# Patient Record
Sex: Female | Born: 1980 | Race: Black or African American | Hispanic: No | Smoking: Former smoker
Health system: Southern US, Community
[De-identification: ages and names within clinical notes are randomized; demographics above are authoritative.]

## PROBLEM LIST (undated history)

## (undated) DIAGNOSIS — O24419 Gestational diabetes mellitus in pregnancy, unspecified control: Secondary | ICD-10-CM

## (undated) DIAGNOSIS — I10 Essential (primary) hypertension: Secondary | ICD-10-CM

## (undated) DIAGNOSIS — Z789 Other specified health status: Secondary | ICD-10-CM

## (undated) HISTORY — DX: Other specified health status: Z78.9

---

## 2018-04-24 DIAGNOSIS — R0789 Other chest pain: Secondary | ICD-10-CM | POA: Diagnosis not present

## 2018-04-24 DIAGNOSIS — R079 Chest pain, unspecified: Secondary | ICD-10-CM | POA: Diagnosis not present

## 2018-05-05 DIAGNOSIS — Z0389 Encounter for observation for other suspected diseases and conditions ruled out: Secondary | ICD-10-CM | POA: Diagnosis not present

## 2018-05-05 DIAGNOSIS — Z3009 Encounter for other general counseling and advice on contraception: Secondary | ICD-10-CM | POA: Diagnosis not present

## 2018-05-05 DIAGNOSIS — Z1388 Encounter for screening for disorder due to exposure to contaminants: Secondary | ICD-10-CM | POA: Diagnosis not present

## 2018-12-28 DIAGNOSIS — Z3201 Encounter for pregnancy test, result positive: Secondary | ICD-10-CM | POA: Diagnosis not present

## 2018-12-28 DIAGNOSIS — O26891 Other specified pregnancy related conditions, first trimester: Secondary | ICD-10-CM | POA: Diagnosis not present

## 2018-12-28 DIAGNOSIS — R102 Pelvic and perineal pain: Secondary | ICD-10-CM | POA: Diagnosis not present

## 2018-12-28 DIAGNOSIS — O219 Vomiting of pregnancy, unspecified: Secondary | ICD-10-CM | POA: Diagnosis not present

## 2018-12-28 DIAGNOSIS — Z3A01 Less than 8 weeks gestation of pregnancy: Secondary | ICD-10-CM | POA: Diagnosis not present

## 2019-01-25 DIAGNOSIS — Z0389 Encounter for observation for other suspected diseases and conditions ruled out: Secondary | ICD-10-CM | POA: Diagnosis not present

## 2019-01-25 DIAGNOSIS — Z3481 Encounter for supervision of other normal pregnancy, first trimester: Secondary | ICD-10-CM | POA: Diagnosis not present

## 2019-01-25 DIAGNOSIS — Z1388 Encounter for screening for disorder due to exposure to contaminants: Secondary | ICD-10-CM | POA: Diagnosis not present

## 2019-01-25 DIAGNOSIS — N760 Acute vaginitis: Secondary | ICD-10-CM | POA: Diagnosis not present

## 2019-01-25 DIAGNOSIS — O99210 Obesity complicating pregnancy, unspecified trimester: Secondary | ICD-10-CM | POA: Diagnosis not present

## 2019-01-25 DIAGNOSIS — O210 Mild hyperemesis gravidarum: Secondary | ICD-10-CM | POA: Diagnosis not present

## 2019-01-25 DIAGNOSIS — R87610 Atypical squamous cells of undetermined significance on cytologic smear of cervix (ASC-US): Secondary | ICD-10-CM | POA: Diagnosis not present

## 2019-01-25 DIAGNOSIS — Z3009 Encounter for other general counseling and advice on contraception: Secondary | ICD-10-CM | POA: Diagnosis not present

## 2019-01-25 DIAGNOSIS — A5901 Trichomonal vulvovaginitis: Secondary | ICD-10-CM | POA: Diagnosis not present

## 2019-01-25 DIAGNOSIS — Z550 Illiteracy and low-level literacy: Secondary | ICD-10-CM | POA: Diagnosis not present

## 2019-01-25 DIAGNOSIS — O09519 Supervision of elderly primigravida, unspecified trimester: Secondary | ICD-10-CM | POA: Diagnosis not present

## 2019-01-25 DIAGNOSIS — Z113 Encounter for screening for infections with a predominantly sexual mode of transmission: Secondary | ICD-10-CM | POA: Diagnosis not present

## 2019-01-26 DIAGNOSIS — Z3481 Encounter for supervision of other normal pregnancy, first trimester: Secondary | ICD-10-CM | POA: Diagnosis not present

## 2019-02-18 ENCOUNTER — Other Ambulatory Visit: Payer: Self-pay

## 2019-02-18 ENCOUNTER — Ambulatory Visit (INDEPENDENT_AMBULATORY_CARE_PROVIDER_SITE_OTHER): Payer: Medicaid Other | Admitting: Family Medicine

## 2019-02-18 ENCOUNTER — Encounter: Payer: Self-pay | Admitting: Family Medicine

## 2019-02-18 VITALS — BP 112/61 | HR 72 | Ht 65.0 in | Wt 231.0 lb

## 2019-02-18 DIAGNOSIS — O09899 Supervision of other high risk pregnancies, unspecified trimester: Secondary | ICD-10-CM | POA: Insufficient documentation

## 2019-02-18 DIAGNOSIS — O09892 Supervision of other high risk pregnancies, second trimester: Secondary | ICD-10-CM | POA: Diagnosis not present

## 2019-02-18 DIAGNOSIS — Z23 Encounter for immunization: Secondary | ICD-10-CM | POA: Diagnosis not present

## 2019-02-18 DIAGNOSIS — Z8759 Personal history of other complications of pregnancy, childbirth and the puerperium: Secondary | ICD-10-CM | POA: Diagnosis not present

## 2019-02-18 DIAGNOSIS — O34219 Maternal care for unspecified type scar from previous cesarean delivery: Secondary | ICD-10-CM

## 2019-02-18 DIAGNOSIS — Z98891 History of uterine scar from previous surgery: Secondary | ICD-10-CM

## 2019-02-18 DIAGNOSIS — O09292 Supervision of pregnancy with other poor reproductive or obstetric history, second trimester: Secondary | ICD-10-CM

## 2019-02-18 NOTE — Progress Notes (Signed)
Subjective:  Kayla Krueger is a [id number] 34w2dbeing seen today for her first obstetrical visit in our office. She was previously seen at [facility] She was referred here due to history of GHTN and miscarriage at 17 weeks. Her obstetrical history is significant for 2 prior cesarean sections. The first one was born via emergency c/s at 25 weeks due to fetal heart rate. Second was repeat cesarean delivery. Patient does intend to breast feed. Pregnancy history fully reviewed.  Patient reports no complaints.  BP 112/61   Pulse 72   Ht '5\' 5"'  (1.651 m)   Wt 231 lb 0.6 oz (104.8 kg)   LMP 11/03/2018 (Exact Date)   BMI 38.45 kg/m   HISTORY: OB History  Gravida Para Term Preterm AB Living  '5 3   3 1 2  ' SAB TAB Ectopic Multiple Live Births  1       2    # Outcome Date GA Lbr Len/2nd Weight Sex Delivery Anes PTL Lv  5 Current           4 Preterm 2015 345w0d M CS-LTranv EPI N LIV  3 Preterm 2014        FD  2 SAB 2014          1 Preterm 2007 [id number] CS-LTranv EPI N LIV    No past medical history on file.  Past Surgical History:  Procedure Laterality Date  . CESAREAN SECTION      Family History  Problem Relation Age of Onset  . Breast cancer Mother      Exam  BP 112/61   Pulse 72   Ht '5\' 5"'  (1.651 m)   Wt 231 lb 0.6 oz (104.8 kg)   LMP 11/03/2018 (Exact Date)   BMI 38.45 kg/m   CONSTITUTIONAL: Well-developed, well-nourished female in no acute distress.  HENT:  Normocephalic, atraumatic, External right and left ear normal. Oropharynx is clear and moist EYES: Conjunctivae and EOM are normal. Pupils are equal, round, and reactive to light. No scleral icterus.  CARDIOVASCULAR: Normal heart rate noted, regular rhythm RESPIRATORY: Clear to auscultation bilaterally. Effort and breath sounds normal, no problems with respiration noted. ABDOMEN: Soft, normal bowel sounds, no distention noted.  No tenderness, rebound or guarding.  MUSCULOSKELETAL: Normal range of motion. No  tenderness.  No cyanosis, clubbing, or edema.  2+ distal pulses. SKIN: Skin is warm and dry. No rash noted. Not diaphoretic. No erythema. No pallor. NEUROLOGIC: Alert and oriented to person, place, and time. Normal reflexes, muscle tone coordination. No cranial nerve deficit noted. PSYCHIATRIC: Normal mood and affect. Normal behavior. Normal judgment and thought content.    Assessment:    Pregnancy: [id number] Active Problem List   Diagnosis Date Noted  . Supervision of other high risk pregnancy, antepartum 02/18/2019  . History of stillbirth in currently pregnant patient, second trimester 02/18/2019  . History of cesarean section 02/18/2019      Plan:   1. Supervision of other high risk pregnancy, antepartum FHT and FH normal - Flu Vaccine QUAD 36+ mos IM - Culture, OB Urine - US [place]M OB DETAIL +14 WK; Future - AFP, Serum, Open Spina Bifida - Genetic Screening  2. History of stillbirth in currently pregnant patient, second trimester Miscarriage at 17 82 weeks3. History of cesarean section First at 25 weeks, next one at 39 weeks. Will need to get records for first c/s to see if classical. - US [place] OB DETAIL +14 WK; Future  4. History of gestational hypertension CMP, UP:C Start ASA 32m. - Comp Met (CMET) - Protein / creatinine ratio, urine     Problem list reviewed and updated. 75% of 30 min visit spent on counseling and coordination of care.     JTruett Mainland10/29/2020

## 2019-02-19 LAB — COMPREHENSIVE METABOLIC PANEL
ALT: 7 IU/L (ref 0–32)
AST: 10 IU/L (ref 0–40)
Albumin/Globulin Ratio: 1.4 (ref 1.2–2.2)
Albumin: 3.7 g/dL — ABNORMAL LOW (ref 3.8–4.8)
Alkaline Phosphatase: 61 IU/L (ref 39–117)
BUN/Creatinine Ratio: 10 (ref 9–23)
BUN: 7 mg/dL (ref 6–20)
Bilirubin Total: <0.2 mg/dL (ref 0.0–1.2)
CO2: 22 mmol/L (ref 20–29)
Calcium: 9.4 mg/dL (ref 8.7–10.2)
Chloride: 101 mmol/L (ref 96–106)
Creatinine, Ser: 0.72 mg/dL (ref 0.57–1.00)
GFR calc Af Amer: 125 mL/min/{1.73_m2} (ref >59)
GFR calc non Af Amer: 108 mL/min/{1.73_m2} (ref >59)
Globulin, Total: 2.7 g/dL (ref 1.5–4.5)
Glucose: 75 mg/dL (ref 65–99)
Potassium: 3.9 mmol/L (ref 3.5–5.2)
Sodium: 136 mmol/L (ref 134–144)
Total Protein: 6.4 g/dL (ref 6.0–8.5)

## 2019-02-20 LAB — URINE CULTURE, OB REFLEX

## 2019-02-20 LAB — CULTURE, OB URINE

## 2019-02-23 DIAGNOSIS — H5213 Myopia, bilateral: Secondary | ICD-10-CM | POA: Diagnosis not present

## 2019-02-23 DIAGNOSIS — H52223 Regular astigmatism, bilateral: Secondary | ICD-10-CM | POA: Diagnosis not present

## 2019-02-23 LAB — AFP, SERUM, OPEN SPINA BIFIDA
AFP MoM: 0.72
AFP Value: 18.1 ng/mL
Gest. Age on Collection Date: 15.2 weeks
Maternal Age At EDD: 37.2 yr
OSBR Risk 1 IN: 10000
Test Results:: NEGATIVE
Weight: 231 [lb_av]

## 2019-03-16 ENCOUNTER — Other Ambulatory Visit: Payer: Self-pay

## 2019-03-16 ENCOUNTER — Ambulatory Visit (HOSPITAL_COMMUNITY): Payer: Medicaid Other | Admitting: *Deleted

## 2019-03-16 ENCOUNTER — Encounter (HOSPITAL_COMMUNITY): Payer: Self-pay

## 2019-03-16 ENCOUNTER — Ambulatory Visit (HOSPITAL_COMMUNITY)
Admission: RE | Admit: 2019-03-16 | Discharge: 2019-03-16 | Disposition: A | Discharge status: Home / Self Care | Payer: Medicaid Other | Source: Ambulatory Visit | Attending: Family Medicine | Admitting: Family Medicine

## 2019-03-16 DIAGNOSIS — O09899 Supervision of other high risk pregnancies, unspecified trimester: Secondary | ICD-10-CM

## 2019-03-16 DIAGNOSIS — Z3A19 19 weeks gestation of pregnancy: Secondary | ICD-10-CM | POA: Diagnosis not present

## 2019-03-16 DIAGNOSIS — Z98891 History of uterine scar from previous surgery: Secondary | ICD-10-CM | POA: Diagnosis not present

## 2019-03-16 DIAGNOSIS — O09292 Supervision of pregnancy with other poor reproductive or obstetric history, second trimester: Secondary | ICD-10-CM

## 2019-03-16 DIAGNOSIS — O09212 Supervision of pregnancy with history of pre-term labor, second trimester: Secondary | ICD-10-CM | POA: Diagnosis not present

## 2019-03-16 DIAGNOSIS — O09522 Supervision of elderly multigravida, second trimester: Secondary | ICD-10-CM | POA: Diagnosis not present

## 2019-03-16 DIAGNOSIS — O34219 Maternal care for unspecified type scar from previous cesarean delivery: Secondary | ICD-10-CM

## 2019-03-22 DIAGNOSIS — O09529 Supervision of elderly multigravida, unspecified trimester: Secondary | ICD-10-CM | POA: Insufficient documentation

## 2019-03-22 NOTE — Progress Notes (Deleted)
Subjective:  Kayla Krueger is a 38 y.o. [id number] at [redacted]w[redacted]d being seen today for ongoing prenatal care.  She is currently monitored for the following issues for this high-risk pregnancy and has Supervision of other high risk pregnancy, antepartum; History of stillbirth in currently pregnant patient, second trimester; History of cesarean section; History of gestational hypertension; and Advanced maternal age in multigravida on their problem list.  Patient reports {sx:14538}.   .  .   . Denies leaking of fluid.   The following portions of the patient's history were reviewed and updated as appropriate: allergies, current medications, past family history, past medical history, past social history, past surgical history and problem list. Problem list updated.  Objective:  There were no vitals filed for this visit.  Fetal Status:           General:  Alert, oriented and cooperative. Patient is in no acute distress.  Skin: Skin is warm and dry. No rash noted.   Cardiovascular: Normal heart rate noted  Respiratory: Normal respiratory effort, no problems with respiration noted  Abdomen: Soft, gravid, appropriate for gestational age.       Pelvic:       {Blank single:19197::"Cervical exam performed","Cervical exam deferred"}        Extremities: Normal range of motion.     Mental Status: Normal mood and affect. Normal behavior. Normal judgment and thought content.   Urinalysis:      Assessment and Plan:  Pregnancy: [id number] at [redacted]w[redacted]d  1. Supervision of other high risk pregnancy, antepartum *** - pt needs f/u anatomy scan scheduled around 04/15/2019 - peds list given - early A1C today - no HIV done with NOB labs, will do today - pregnancy history clarification?  2. History of stillbirth in currently pregnant patient, second trimester *** - miscarriage @17wks  - pt declined 17P @MFM   3. History of cesarean section *** - needs ROI for first C/S, RN aware  4. History of gestational  hypertension *** - BP today *** - pt on ASA - PCr baseline not done with NOB labs, will perform today  Preterm labor symptoms and general obstetric precautions including but not limited to vaginal bleeding, contractions, leaking of fluid and fetal movement were reviewed in detail with the patient. Please refer to After Visit Summary for other counseling recommendations.  No follow-ups on file.   Jameek Bruntz, Gerrie Nordmann, NP

## 2019-03-23 ENCOUNTER — Encounter: Payer: Medicaid Other | Admitting: Women's Health

## 2019-03-27 DIAGNOSIS — H5212 Myopia, left eye: Secondary | ICD-10-CM | POA: Diagnosis not present

## 2019-05-06 ENCOUNTER — Encounter: Payer: Medicaid Other | Admitting: Obstetrics & Gynecology

## 2019-05-07 ENCOUNTER — Inpatient Hospital Stay (HOSPITAL_COMMUNITY)
Admission: AD | Admit: 2019-05-07 | Discharge: 2019-05-07 | Disposition: A | Discharge status: Home / Self Care | Payer: Medicaid Other | Attending: Obstetrics and Gynecology | Admitting: Obstetrics and Gynecology

## 2019-05-07 ENCOUNTER — Encounter (HOSPITAL_COMMUNITY): Payer: Self-pay | Admitting: Obstetrics and Gynecology

## 2019-05-07 ENCOUNTER — Encounter: Payer: Medicaid Other | Admitting: Obstetrics & Gynecology

## 2019-05-07 ENCOUNTER — Other Ambulatory Visit: Payer: Self-pay

## 2019-05-07 DIAGNOSIS — O34219 Maternal care for unspecified type scar from previous cesarean delivery: Secondary | ICD-10-CM | POA: Diagnosis not present

## 2019-05-07 DIAGNOSIS — Z8632 Personal history of gestational diabetes: Secondary | ICD-10-CM | POA: Insufficient documentation

## 2019-05-07 DIAGNOSIS — Z87891 Personal history of nicotine dependence: Secondary | ICD-10-CM | POA: Diagnosis not present

## 2019-05-07 DIAGNOSIS — Z3A26 26 weeks gestation of pregnancy: Secondary | ICD-10-CM | POA: Diagnosis not present

## 2019-05-07 DIAGNOSIS — Z0371 Encounter for suspected problem with amniotic cavity and membrane ruled out: Secondary | ICD-10-CM | POA: Insufficient documentation

## 2019-05-07 DIAGNOSIS — O162 Unspecified maternal hypertension, second trimester: Secondary | ICD-10-CM | POA: Insufficient documentation

## 2019-05-07 DIAGNOSIS — Z8759 Personal history of other complications of pregnancy, childbirth and the puerperium: Secondary | ICD-10-CM | POA: Insufficient documentation

## 2019-05-07 DIAGNOSIS — O09522 Supervision of elderly multigravida, second trimester: Secondary | ICD-10-CM | POA: Diagnosis not present

## 2019-05-07 DIAGNOSIS — Z3689 Encounter for other specified antenatal screening: Secondary | ICD-10-CM

## 2019-05-07 DIAGNOSIS — Z803 Family history of malignant neoplasm of breast: Secondary | ICD-10-CM | POA: Insufficient documentation

## 2019-05-07 DIAGNOSIS — O09292 Supervision of pregnancy with other poor reproductive or obstetric history, second trimester: Secondary | ICD-10-CM

## 2019-05-07 DIAGNOSIS — O0992 Supervision of high risk pregnancy, unspecified, second trimester: Secondary | ICD-10-CM | POA: Insufficient documentation

## 2019-05-07 DIAGNOSIS — Z98891 History of uterine scar from previous surgery: Secondary | ICD-10-CM

## 2019-05-07 DIAGNOSIS — O09899 Supervision of other high risk pregnancies, unspecified trimester: Secondary | ICD-10-CM

## 2019-05-07 HISTORY — DX: Essential (primary) hypertension: I10

## 2019-05-07 HISTORY — DX: Gestational diabetes mellitus in pregnancy, unspecified control: O24.419

## 2019-05-07 LAB — CBC
HCT: 35.1 % — ABNORMAL LOW (ref 36.0–46.0)
Hemoglobin: 11.4 g/dL — ABNORMAL LOW (ref 12.0–15.0)
MCH: 27.8 pg (ref 26.0–34.0)
MCHC: 32.5 g/dL (ref 30.0–36.0)
MCV: 85.6 fL (ref 80.0–100.0)
Platelets: 334 10*3/uL (ref 150–400)
RBC: 4.1 MIL/uL (ref 3.87–5.11)
RDW: 14.9 % (ref 11.5–15.5)
WBC: 11.3 10*3/uL — ABNORMAL HIGH (ref 4.0–10.5)
nRBC: 0 % (ref 0.0–0.2)

## 2019-05-07 LAB — COMPREHENSIVE METABOLIC PANEL
ALT: 16 U/L (ref 0–44)
AST: 17 U/L (ref 15–41)
Albumin: 2.6 g/dL — ABNORMAL LOW (ref 3.5–5.0)
Alkaline Phosphatase: 79 U/L (ref 38–126)
Anion gap: 10 (ref 5–15)
BUN: 6 mg/dL (ref 6–20)
CO2: 23 mmol/L (ref 22–32)
Calcium: 9 mg/dL (ref 8.9–10.3)
Chloride: 104 mmol/L (ref 98–111)
Creatinine, Ser: 0.75 mg/dL (ref 0.44–1.00)
GFR calc Af Amer: >60 mL/min (ref >60)
GFR calc non Af Amer: >60 mL/min (ref >60)
Glucose, Bld: 102 mg/dL — ABNORMAL HIGH (ref 70–99)
Potassium: 3.5 mmol/L (ref 3.5–5.1)
Sodium: 137 mmol/L (ref 135–145)
Total Bilirubin: 0.4 mg/dL (ref 0.3–1.2)
Total Protein: 6.5 g/dL (ref 6.5–8.1)

## 2019-05-07 LAB — PROTEIN / CREATININE RATIO, URINE
Creatinine, Urine: 337.32 mg/dL
Protein Creatinine Ratio: 0.13 mg/mg{Cre} (ref 0.00–0.15)
Total Protein, Urine: 44 mg/dL

## 2019-05-07 LAB — FETAL FIBRONECTIN: Fetal Fibronectin: NEGATIVE

## 2019-05-07 LAB — AMNISURE RUPTURE OF MEMBRANE (ROM) NOT AT ARMC: Amnisure ROM: NEGATIVE

## 2019-05-07 NOTE — MAU Provider Note (Signed)
History     CSN: [id number]  Arrival date and time: 05/07/19 1415   First Provider Initiated Contact with Patient 05/07/19 1551      Chief Complaint  Patient presents with  . Rupture of Membranes   [name] is a 39 y.o. [id number] at [redacted]w[redacted]d who presents to MAU for possible ROM. Pt denies anything in the vagina in the past 24hrs.  Onset: 1hr ago Location: vagina Duration: <24hrs Character: pt reports about an hour ago she started walking to leave the house and reports she felt some fluid soak through to her underwear, pt describes fluid as clear with white/milky substance at end, pt reports this was a one time instance and has not happened again since Aggravating/Associated: none/cramps bilaterally x2.5wks without worsening Relieving: none Treatment: none Severity: 0/10  Pt denies VB, ctx, decreased FM, vaginal discharge/odor/itching. Pt denies N/V, abdominal pain, constipation, diarrhea, or urinary problems. Pt denies fever, chills, fatigue, sweating or changes in appetite. Pt denies SOB or chest pain. Pt denies dizziness, HA, light-headedness, weakness.  Problems this pregnancy include: hx PTD, hx C/S x2. Allergies? NKDA Current medications/supplements? PNVs Prenatal care provider? [facility], next appt 05/11/2019   OB History    Gravida  5   Para  3   Term      Preterm  3   AB  1   Living  2     SAB  1   TAB      Ectopic      Multiple      Live Births  2           Past Medical History:  Diagnosis Date  . Gestational diabetes   . Hypertension   . Medical history non-contributory     Past Surgical History:  Procedure Laterality Date  . CESAREAN SECTION      Family History  Problem Relation Age of Onset  . Breast cancer Mother     Social History   Tobacco Use  . Smoking status: Former Smoker    Types: Cigarettes    Quit date: 05/06/2018    Years since quitting: 1.0  . Smokeless tobacco: Never Used  Substance Use Topics  .  Alcohol use: Never  . Drug use: Never    Allergies: No Known Allergies  Medications Prior to Admission  Medication Sig Dispense Refill Last Dose  . Prenatal Vit-Fe Fumarate-FA (PRENATAL VITAMINS PO) Take by mouth.   05/06/2019 at Unknown time    Review of Systems  Constitutional: Negative for chills, diaphoresis, fatigue and fever.  Eyes: Negative for visual disturbance.  Respiratory: Negative for shortness of breath.   Cardiovascular: Negative for chest pain.  Gastrointestinal: Negative for abdominal pain, constipation, diarrhea, nausea and vomiting.  Genitourinary: Positive for pelvic pain (intermittent cramping) and vaginal discharge. Negative for dysuria, flank pain, frequency, urgency and vaginal bleeding.  Neurological: Negative for dizziness, weakness, light-headedness and headaches.   Physical Exam   Blood pressure (!) 147/76, pulse (!) 112, temperature 98.4 F (36.9 C), temperature source Oral, resp. rate 17, height 5\' 5"  (1.651 m), weight 111 kg, last menstrual period 11/03/2018, SpO2 98 %.  Patient Vitals for the past 24 hrs:  BP Temp Temp src Pulse Resp SpO2 Height Weight  05/07/19 1853 (!) 147/76 -- -- -- -- -- -- --  05/07/19 1705 -- -- -- -- -- 98 % -- --  05/07/19 1700 123/70 -- -- (!) 112 -- 98 % -- --  05/07/19 1650 -- -- -- -- --  99 % -- --  05/07/19 1645 120/72 -- -- (!) 110 -- 99 % -- --  05/07/19 1642 124/71 -- -- (!) 119 -- -- -- --  05/07/19 1640 -- -- -- -- -- 99 % -- --  05/07/19 1535 -- -- -- -- -- 98 % -- --  05/07/19 1534 140/70 -- -- (!) 108 -- -- -- --  05/07/19 1502 126/71 98.4 F (36.9 C) Oral (!) 101 17 98 % 5\' 5"  (1.651 m) 111 kg   Physical Exam  Constitutional: She is oriented to person, place, and time. She appears well-developed and well-nourished. No distress.  HENT:  Head: Normocephalic and atraumatic.  Respiratory: Effort normal.  GI: Soft. She exhibits no distension and no mass. There is no abdominal tenderness. There is no rebound  and no guarding.  Genitourinary:    No vaginal discharge.   Neurological: She is alert and oriented to person, place, and time.  Skin: Skin is warm and dry. She is not diaphoretic.  Psychiatric: She has a normal mood and affect. Her behavior is normal. Judgment and thought content normal.   Results for orders placed or performed during the hospital encounter of 05/07/19 (from the past 24 hour(s))  CBC     Status: Abnormal   Collection Time: 05/07/19  4:00 PM  Result Value Ref Range   WBC 11.3 (H) 4.0 - 10.5 K/uL   RBC 4.10 3.87 - 5.11 MIL/uL   Hemoglobin 11.4 (L) 12.0 - 15.0 g/dL   HCT 05/09/19 (L) 40.8 - 14.4 %   MCV 85.6 80.0 - 100.0 fL   MCH 27.8 26.0 - 34.0 pg   MCHC 32.5 30.0 - 36.0 g/dL   RDW 81.8 56.3 - 14.9 %   Platelets 334 150 - 400 K/uL   nRBC 0.0 0.0 - 0.2 %  Comprehensive metabolic panel     Status: Abnormal   Collection Time: 05/07/19  4:00 PM  Result Value Ref Range   Sodium 137 135 - 145 mmol/L   Potassium 3.5 3.5 - 5.1 mmol/L   Chloride 104 98 - 111 mmol/L   CO2 23 22 - 32 mmol/L   Glucose, Bld 102 (H) 70 - 99 mg/dL   BUN 6 6 - 20 mg/dL   Creatinine, Ser 05/09/19 0.44 - 1.00 mg/dL   Calcium 9.0 8.9 - 6.37 mg/dL   Total Protein 6.5 6.5 - 8.1 g/dL   Albumin 2.6 (L) 3.5 - 5.0 g/dL   AST 17 15 - 41 U/L   ALT 16 0 - 44 U/L   Alkaline Phosphatase 79 38 - 126 U/L   Total Bilirubin 0.4 0.3 - 1.2 mg/dL   GFR calc non Af Amer >60 >60 mL/min   GFR calc Af Amer >60 >60 mL/min   Anion gap 10 5 - 15  Amnisure rupture of membrane (rom)not at [facility]     Status: None   Collection Time: 05/07/19  5:14 PM  Result Value Ref Range   Amnisure ROM NEGATIVE   Fetal fibronectin     Status: None   Collection Time: 05/07/19  5:14 PM  Result Value Ref Range   Fetal Fibronectin NEGATIVE NEGATIVE  Protein / creatinine ratio, urine     Status: None   Collection Time: 05/07/19  5:30 PM  Result Value Ref Range   Creatinine, Urine 337.32 mg/dL   Total Protein, Urine 44 mg/dL   Protein  Creatinine Ratio 0.13 0.00 - 0.15 mg/mg[Cre]   MAU Course  Procedures  MDM -  r/o ROM -hx of PTD @26wks  -pressure 140/70, preeclampsia labs drawn, pt has cHTN -CE: long/closed (external os 0.5, internal closed)/posterior -UA: pending at time of discharge (pt not able to urinate until late in MAU course) -CBC: WNL, platelets 334 -CMP: WNL, AST/ALT 17/16, serum creatinine 0.75 -PCr: 0.13 -fFN: negative -AmniSure: negative -ZGY:[id number]       -baseline: 150       -variability: moderate       -accels: present, 10x10 and 15x15       -decels: few variables       -TOCO: no ctx -pt discharged to home in stable condition  Orders Placed This Encounter  Procedures  . Culture, OB Urine    Standing Status:   Standing    Number of Occurrences:   1  . CBC    Standing Status:   Standing    Number of Occurrences:   1  . Comprehensive metabolic panel    Standing Status:   Standing    Number of Occurrences:   1  . Protein / creatinine ratio, urine    Standing Status:   Standing    Number of Occurrences:   1  . Amnisure rupture of membrane (rom)not at [facility]    Standing Status:   Standing    Number of Occurrences:   1  . Fetal fibronectin    Standing Status:   Standing    Number of Occurrences:   1  . Urinalysis, Routine w reflex microscopic    Standing Status:   Standing    Number of Occurrences:   1  . Discharge patient    Order Specific Question:   Discharge disposition    Answer:   01-Home or Self Care [1]    Order Specific Question:   Discharge patient date    Answer:   05/07/2019   Assessment and Plan   1. Encounter for suspected premature rupture of amniotic membranes, with rupture of membranes not found   2. Multigravida of advanced maternal age in second trimester   3. Supervision of other high risk pregnancy, antepartum   4. History of stillbirth in currently pregnant patient, second trimester   5. History of cesarean section   6. [redacted] weeks gestation of pregnancy   7. NST  (non-stress test) reactive    Allergies as of 05/07/2019   No Known Allergies     Medication List    TAKE these medications   PRENATAL VITAMINS PO Take by mouth.      -discussed test results and meaning -discussed s/sx of PPROM/PTL, information given -return MAU precautions given -pt discharged to home in stable condition  Gerrie Nordmann Paulena Servais 05/07/2019, 6:56 PM

## 2019-05-07 NOTE — Discharge Instructions (Signed)
Round Ligament Pain  The round ligament is a cord of muscle and tissue that helps support the uterus. It can become a source of pain during pregnancy if it becomes stretched or twisted as the baby grows. The pain usually begins in the second trimester (13-28 weeks) of pregnancy, and it can come and go until the baby is delivered. It is not a serious problem, and it does not cause harm to the baby. Round ligament pain is usually a short, sharp, and pinching pain, but it can also be a dull, lingering, and aching pain. The pain is felt in the lower side of the abdomen or in the groin. It usually starts deep in the groin and moves up to the outside of the hip area. The pain may occur when you:  Suddenly change position, such as quickly going from a sitting to standing position.  Roll over in bed.  Cough or sneeze.  Do physical activity. Follow these instructions at home:   Watch your condition for any changes.  When the pain starts, relax. Then try any of these methods to help with the pain: ? Sitting down. ? Flexing your knees up to your abdomen. ? Lying on your side with one pillow under your abdomen and another pillow between your legs. ? Sitting in a warm bath for 15-20 minutes or until the pain goes away.  Take over-the-counter and prescription medicines only as told by your health care provider.  Move slowly when you sit down or stand up.  Avoid long walks if they cause pain.  Stop or reduce your physical activities if they cause pain.  Keep all follow-up visits as told by your health care provider. This is important. Contact a health care provider if:  Your pain does not go away with treatment.  You feel pain in your back that you did not have before.  Your medicine is not helping. Get help right away if:  You have a fever or chills.  You develop uterine contractions.  You have vaginal bleeding.  You have nausea or vomiting.  You have diarrhea.  You have pain  when you urinate. Summary  Round ligament pain is felt in the lower abdomen or groin. It is usually a short, sharp, and pinching pain. It can also be a dull, lingering, and aching pain.  This pain usually begins in the second trimester (13-28 weeks). It occurs because the uterus is stretching with the growing baby, and it is not harmful to the baby.  You may notice the pain when you suddenly change position, when you cough or sneeze, or during physical activity.  Relaxing, flexing your knees to your abdomen, lying on one side, or taking a warm bath may help to get rid of the pain.  Get help from your health care provider if the pain does not go away or if you have vaginal bleeding, nausea, vomiting, diarrhea, or painful urination. This information is not intended to replace advice given to you by your health care provider. Make sure you discuss any questions you have with your health care provider. Document Revised: 09/24/2017 Document Reviewed: 09/24/2017 Elsevier Patient Education  2020 [employer]. Second Trimester of Pregnancy The second trimester is from week 14 through week 27 (months 4 through 6). The second trimester is often a time when you feel your best. Your body has adjusted to being pregnant, and you begin to feel better physically. Usually, morning sickness has lessened or quit completely, you may have more  energy, and you may have an increase in appetite. The second trimester is also a time when the fetus is growing rapidly. At the end of the sixth month, the fetus is about 9 inches long and weighs about 1 pounds. You will likely begin to feel the baby move (quickening) between 16 and 20 weeks of pregnancy. Body changes during your second trimester Your body continues to go through many changes during your second trimester. The changes vary from woman to woman.  Your weight will continue to increase. You will notice your lower abdomen bulging out.  You may begin to get  stretch marks on your hips, abdomen, and breasts.  You may develop headaches that can be relieved by medicines. The medicines should be approved by your health care provider.  You may urinate more often because the fetus is pressing on your bladder.  You may develop or continue to have heartburn as a result of your pregnancy.  You may develop constipation because certain hormones are causing the muscles that push waste through your intestines to slow down.  You may develop hemorrhoids or swollen, bulging veins (varicose veins).  You may have back pain. This is caused by: ? Weight gain. ? Pregnancy hormones that are relaxing the joints in your pelvis. ? A shift in weight and the muscles that support your balance.  Your breasts will continue to grow and they will continue to become tender.  Your gums may bleed and may be sensitive to brushing and flossing.  Dark spots or blotches (chloasma, mask of pregnancy) may develop on your face. This will likely fade after the baby is born.  A dark line from your belly button to the pubic area (linea nigra) may appear. This will likely fade after the baby is born.  You may have changes in your hair. These can include thickening of your hair, rapid growth, and changes in texture. Some women also have hair loss during or after pregnancy, or hair that feels dry or thin. Your hair will most likely return to normal after your baby is born. What to expect at prenatal visits During a routine prenatal visit:  You will be weighed to make sure you and the fetus are growing normally.  Your blood pressure will be taken.  Your abdomen will be measured to track your baby's growth.  The fetal heartbeat will be listened to.  Any test results from the previous visit will be discussed. Your health care provider may ask you:  How you are feeling.  If you are feeling the baby move.  If you have had any abnormal symptoms, such as leaking fluid, bleeding,  severe headaches, or abdominal cramping.  If you are using any tobacco products, including cigarettes, chewing tobacco, and electronic cigarettes.  If you have any questions. Other tests that may be performed during your second trimester include:  Blood tests that check for: ? Low iron levels (anemia). ? High blood sugar that affects pregnant women (gestational diabetes) between 1524 and 28 weeks. ? Rh antibodies. This is to check for a protein on red blood cells (Rh factor).  Urine tests to check for infections, diabetes, or protein in the urine.  An ultrasound to confirm the proper growth and development of the baby.  An amniocentesis to check for possible genetic problems.  Fetal screens for spina bifida and Down syndrome.  HIV (human immunodeficiency virus) testing. Routine prenatal testing includes screening for HIV, unless you choose not to have this test. Follow these  instructions at home: Medicines  Follow your health care provider's instructions regarding medicine use. Specific medicines may be either safe or unsafe to take during pregnancy.  Take a prenatal vitamin that contains at least 600 micrograms (mcg) of folic acid.  If you develop constipation, try taking a stool softener if your health care provider approves. Eating and drinking   Eat a balanced diet that includes fresh fruits and vegetables, whole grains, good sources of protein such as meat, eggs, or tofu, and low-fat dairy. Your health care provider will help you determine the amount of weight gain that is right for you.  Avoid raw meat and uncooked cheese. These carry germs that can cause birth defects in the baby.  If you have low calcium intake from food, talk to your health care provider about whether you should take a daily calcium supplement.  Limit foods that are high in fat and processed sugars, such as fried and sweet foods.  To prevent constipation: ? Drink enough fluid to keep your urine clear  or pale yellow. ? Eat foods that are high in fiber, such as fresh fruits and vegetables, whole grains, and beans. Activity  Exercise only as directed by your health care provider. Most women can continue their usual exercise routine during pregnancy. Try to exercise for 30 minutes at least 5 days a week. Stop exercising if you experience uterine contractions.  Avoid heavy lifting, wear low heel shoes, and practice good posture.  A sexual relationship may be continued unless your health care provider directs you otherwise. Relieving pain and discomfort  Wear a good support bra to prevent discomfort from breast tenderness.  Take warm sitz baths to soothe any pain or discomfort caused by hemorrhoids. Use hemorrhoid cream if your health care provider approves.  Rest with your legs elevated if you have leg cramps or low back pain.  If you develop varicose veins, wear support hose. Elevate your feet for 15 minutes, 3-4 times a day. Limit salt in your diet. Prenatal Care  Write down your questions. Take them to your prenatal visits.  Keep all your prenatal visits as told by your health care provider. This is important. Safety  Wear your seat belt at all times when driving.  Make a list of emergency phone numbers, including numbers for family, friends, the hospital, and police and fire departments. General instructions  Ask your health care provider for a referral to a local prenatal education class. Begin classes no later than the beginning of month 6 of your pregnancy.  Ask for help if you have counseling or nutritional needs during pregnancy. Your health care provider can offer advice or refer you to specialists for help with various needs.  Do not use hot tubs, steam rooms, or saunas.  Do not douche or use tampons or scented sanitary pads.  Do not cross your legs for long periods of time.  Avoid cat litter boxes and soil used by cats. These carry germs that can cause birth defects  in the baby and possibly loss of the fetus by miscarriage or stillbirth.  Avoid all smoking, herbs, alcohol, and unprescribed drugs. Chemicals in these products can affect the formation and growth of the baby.  Do not use any products that contain nicotine or tobacco, such as cigarettes and e-cigarettes. If you need help quitting, ask your health care provider.  Visit your dentist if you have not gone yet during your pregnancy. Use a soft toothbrush to brush your teeth and be gentle  when you floss. Contact a health care provider if:  You have dizziness.  You have mild pelvic cramps, pelvic pressure, or nagging pain in the abdominal area.  You have persistent nausea, vomiting, or diarrhea.  You have a bad smelling vaginal discharge.  You have pain when you urinate. Get help right away if:  You have a fever.  You are leaking fluid from your vagina.  You have spotting or bleeding from your vagina.  You have severe abdominal cramping or pain.  You have rapid weight gain or weight loss.  You have shortness of breath with chest pain.  You notice sudden or extreme swelling of your face, hands, ankles, feet, or legs.  You have not felt your baby move in over an hour.  You have severe headaches that do not go away when you take medicine.  You have vision changes. Summary  The second trimester is from week 14 through week 27 (months 4 through 6). It is also a time when the fetus is growing rapidly.  Your body goes through many changes during pregnancy. The changes vary from woman to woman.  Avoid all smoking, herbs, alcohol, and unprescribed drugs. These chemicals affect the formation and growth your baby.  Do not use any tobacco products, such as cigarettes, chewing tobacco, and e-cigarettes. If you need help quitting, ask your health care provider.  Contact your health care provider if you have any questions. Keep all prenatal visits as told by your health care provider. This  is important. This information is not intended to replace advice given to you by your health care provider. Make sure you discuss any questions you have with your health care provider. Document Revised: 07/31/2018 Document Reviewed: 05/14/2016 Elsevier Patient Education  [place]. Preterm Labor and Birth Information  The normal length of a pregnancy is 39-41 weeks. Preterm labor is when labor starts before 37 completed weeks of pregnancy. What are the risk factors for preterm labor? Preterm labor is more likely to occur in women who:  Have certain infections during pregnancy such as a bladder infection, sexually transmitted infection, or infection inside the uterus (chorioamnionitis).  Have a shorter-than-normal cervix.  Have gone into preterm labor before.  Have had surgery on their cervix.  Are younger than age 56 or older than age 30.  Are African American.  Are pregnant with twins or multiple babies (multiple gestation).  Take street drugs or smoke while pregnant.  Do not gain enough weight while pregnant.  Became pregnant shortly after having been pregnant. What are the symptoms of preterm labor? Symptoms of preterm labor include:  Cramps similar to those that can happen during a menstrual period. The cramps may happen with diarrhea.  Pain in the abdomen or lower back.  Regular uterine contractions that may feel like tightening of the abdomen.  A feeling of increased pressure in the pelvis.  Increased watery or bloody mucus discharge from the vagina.  Water breaking (ruptured amniotic sac). Why is it important to recognize signs of preterm labor? It is important to recognize signs of preterm labor because babies who are born prematurely may not be fully developed. This can put them at an increased risk for:  Long-term (chronic) heart and lung problems.  Difficulty immediately after birth with regulating body systems, including blood sugar, body  temperature, heart rate, and breathing rate.  Bleeding in the brain.  Cerebral palsy.  Learning difficulties.  Death. These risks are highest for babies who are born before  34 weeks of pregnancy. How is preterm labor treated? Treatment depends on the length of your pregnancy, your condition, and the health of your baby. It may involve:  Having a stitch (suture) placed in your cervix to prevent your cervix from opening too early (cerclage).  Taking or being given medicines, such as: ? Hormone medicines. These may be given early in pregnancy to help support the pregnancy. ? Medicine to stop contractions. ? Medicines to help mature the baby's lungs. These may be prescribed if the risk of delivery is high. ? Medicines to prevent your baby from developing cerebral palsy. If the labor happens before 34 weeks of pregnancy, you may need to stay in the hospital. What should I do if I think I am in preterm labor? If you think that you are going into preterm labor, call your health care provider right away. How can I prevent preterm labor in future pregnancies? To increase your chance of having a full-term pregnancy:  Do not use any tobacco products, such as cigarettes, chewing tobacco, and e-cigarettes. If you need help quitting, ask your health care provider.  Do not use street drugs or medicines that have not been prescribed to you during your pregnancy.  Talk with your health care provider before taking any herbal supplements, even if you have been taking them regularly.  Make sure you gain a healthy amount of weight during your pregnancy.  Watch for infection. If you think that you might have an infection, get it checked right away.  Make sure to tell your health care provider if you have gone into preterm labor before. This information is not intended to replace advice given to you by your health care provider. Make sure you discuss any questions you have with your health care  provider. Document Revised: 07/31/2018 Document Reviewed: 08/30/2015 Elsevier Patient Education  2020 [employer].

## 2019-05-07 NOTE — MAU Note (Signed)
Pt reports a gush of fluid at 1400. Fluid was thin but also had a milky white appearance. Says it "filled her underwear."  Denies contractions, VB. +FM

## 2019-05-09 LAB — CULTURE, OB URINE: Culture: NO GROWTH

## 2019-05-10 ENCOUNTER — Encounter: Payer: Self-pay | Admitting: *Deleted

## 2019-05-11 ENCOUNTER — Other Ambulatory Visit: Payer: Self-pay

## 2019-05-11 ENCOUNTER — Encounter: Payer: Self-pay | Admitting: Advanced Practice Midwife

## 2019-05-11 ENCOUNTER — Ambulatory Visit (INDEPENDENT_AMBULATORY_CARE_PROVIDER_SITE_OTHER): Payer: Medicaid Other | Admitting: Advanced Practice Midwife

## 2019-05-11 VITALS — BP 129/70 | HR 89 | Wt 244.0 lb

## 2019-05-11 DIAGNOSIS — Z23 Encounter for immunization: Secondary | ICD-10-CM

## 2019-05-11 DIAGNOSIS — O09892 Supervision of other high risk pregnancies, second trimester: Secondary | ICD-10-CM

## 2019-05-11 DIAGNOSIS — Z3A27 27 weeks gestation of pregnancy: Secondary | ICD-10-CM

## 2019-05-11 DIAGNOSIS — O09899 Supervision of other high risk pregnancies, unspecified trimester: Secondary | ICD-10-CM

## 2019-05-11 DIAGNOSIS — O09292 Supervision of pregnancy with other poor reproductive or obstetric history, second trimester: Secondary | ICD-10-CM

## 2019-05-11 DIAGNOSIS — Z98891 History of uterine scar from previous surgery: Secondary | ICD-10-CM

## 2019-05-11 MED ORDER — AMBULATORY NON FORMULARY MEDICATION: 1.0000 | 0 refills | Status: DC

## 2019-05-11 NOTE — Progress Notes (Signed)
   PRENATAL VISIT NOTE  Subjective:  Kayla Krueger is a 39 y.o. [id number] at [redacted]w[redacted]d being seen today for ongoing prenatal care.  She is currently monitored for the following issues for this high-risk pregnancy and has Supervision of other high risk pregnancy, antepartum; History of stillbirth in currently pregnant patient, second trimester; History of cesarean section; History of gestational hypertension; and Advanced maternal age in multigravida on their problem list.  Patient reports no complaints.  Contractions: Not present. Vag. Bleeding: None.  Movement: Present. Denies leaking of fluid.   Missed visits due to fear of Covid.  Didn't realize we could do them virtually  The following portions of the patient's history were reviewed and updated as appropriate: allergies, current medications, past family history, past medical history, past social history, past surgical history and problem list.   Objective:   Vitals:   05/11/19 0956  BP: 129/70  Pulse: 89  Weight: 244 lb (110.7 kg)    Fetal Status: Fetal Heart Rate (bpm): 144   Movement: Present     General:  Alert, oriented and cooperative. Patient is in no acute distress.  Skin: Skin is warm and dry. No rash noted.   Cardiovascular: Normal heart rate noted  Respiratory: Normal respiratory effort, no problems with respiration noted  Abdomen: Soft, gravid, appropriate for gestational age.  Pain/Pressure: Present     Pelvic: Cervical exam deferred        Extremities: Normal range of motion.  Edema: Trace  Mental Status: Normal mood and affect. Normal behavior. Normal judgment and thought content.   Assessment and Plan:  Pregnancy: [id number] at [redacted]w[redacted]d 1. Supervision of other high risk pregnancy, antepartum      Reviewed hx GDM, failed 1 hr test early pregn, will do 2 hr this week      TDAP given       Pt would like covid vaccine when able - Tdap vaccine greater than or equal to 7yo IM  2. History of stillbirth in currently pregnant  patient, second trimester   3. History of cesarean section     Never signed ROI for Op Notes. Signed today.  Had C/S at [facility] in [place]  Preterm labor symptoms and general obstetric precautions including but not limited to vaginal bleeding, contractions, leaking of fluid and fetal movement were reviewed in detail with the patient. Please refer to After Visit Summary for other counseling recommendations.   Return in about 2 weeks (around 05/25/2019) for TELEHEALTH VISIT.  Wants next visit virtual  Wynelle Bourgeois, CNM

## 2019-05-11 NOTE — Patient Instructions (Signed)
Gestational Diabetes Mellitus, Diagnosis Gestational diabetes (gestational diabetes mellitus) is a short-term (temporary) form of diabetes that can happen during pregnancy. It goes away after you give birth. It may be caused by one or both of these problems:  Your pancreas does not make enough of a hormone called insulin.  Your body does not respond in a normal way to insulin that it makes. Insulin lets sugars (glucose) go into cells in the body. This gives you energy. If you have diabetes, sugars cannot get into cells. This causes high blood sugar (hyperglycemia). If you get gestational diabetes, you are:  More likely to get it if you get pregnant again.  More likely to develop type 2 diabetes in the future. If gestational diabetes is treated, it may not hurt you or your baby. Your doctor will set treatment goals for you. In general, you should have these blood sugar levels:  After not eating for a long time (fasting): 95 mg/dL (5.3 mmol/L).  After meals (postprandial): ? One hour after a meal: at or below 140 mg/dL (7.8 mmol/L). ? Two hours after a meal: at or below 120 mg/dL (6.7 mmol/L).  A1c (hemoglobin A1c) level: 6-6.5%. Follow these instructions at home: Questions to ask your doctor   You may want to ask these questions: ? Do I need to meet with a diabetes educator? ? What equipment will I need to care for myself at home? ? What medicines do I need? When should I take them? ? How often do I need to check my blood sugar? ? What number can I call if I have questions? ? When is my next doctor's visit? General instructions  Take over-the-counter and prescription medicines only as told by your doctor.  Stay at a healthy weight during pregnancy.  Keep all follow-up visits as told by your doctor. This is important. Contact a doctor if:  Your blood sugar is at or above 240 mg/dL (15.1 mmol/L).  Your blood sugar is at or above 200 mg/dL (76.1 mmol/L) and you have ketones in  your pee (urine).  You have been sick or have had a fever for 2 days or more and you are not getting better.  You have any of these problems for more than 6 hours: ? You cannot eat or drink. ? You feel sick to your stomach (nauseous). ? You throw up (vomit). ? You have watery poop (diarrhea). Get help right away if:  Your blood sugar is lower than 54 mg/dL (3 mmol/L).  You get confused.  You have trouble: ? Thinking clearly. ? Breathing.  Your baby moves less than normal.  You have any of these: ? Moderate or large ketone levels in your pee. ? Blood coming from your vagina. ? Unusual fluid coming from your vagina. ? Early contractions. These may feel like tightness in your belly. Summary  Gestational diabetes is a short-term form of diabetes. It can happen while you are pregnant. It goes away after you give birth.  If gestational diabetes is treated, it may not hurt you or your baby. Your doctor will set treatment goals for you.  Keep all follow-up visits as told by your doctor. This is important. This information is not intended to replace advice given to you by your health care provider. Make sure you discuss any questions you have with your health care provider. Document Revised: 05/15/2017 Document Reviewed: 05/12/2015 Elsevier Patient Education  2020 [employer]. Third Trimester of Pregnancy  The third trimester is from week  28 through week 40 (months 7 through 9). This trimester is when your unborn baby (fetus) is growing very fast. At the end of the ninth month, the unborn baby is about 20 inches in length. It weighs about 6-10 pounds. Follow these instructions at home: Medicines  Take over-the-counter and prescription medicines only as told by your doctor. Some medicines are safe and some medicines are not safe during pregnancy.  Take a prenatal vitamin that contains at least 600 micrograms (mcg) of folic acid.  If you have trouble pooping (constipation), take  medicine that will make your stool soft (stool softener) if your doctor approves. Eating and drinking   Eat regular, healthy meals.  Avoid raw meat and uncooked cheese.  If you get low calcium from the food you eat, talk to your doctor about taking a daily calcium supplement.  Eat four or five small meals rather than three large meals a day.  Avoid foods that are high in fat and sugars, such as fried and sweet foods.  To prevent constipation: ? Eat foods that are high in fiber, like fresh fruits and vegetables, whole grains, and beans. ? Drink enough fluids to keep your pee (urine) clear or pale yellow. Activity  Exercise only as told by your doctor. Stop exercising if you start to have cramps.  Avoid heavy lifting, wear low heels, and sit up straight.  Do not exercise if it is too hot, too humid, or if you are in a place of great height (high altitude).  You may continue to have sex unless your doctor tells you not to. Relieving pain and discomfort  Wear a good support bra if your breasts are tender.  Take frequent breaks and rest with your legs raised if you have leg cramps or low back pain.  Take warm water baths (sitz baths) to soothe pain or discomfort caused by hemorrhoids. Use hemorrhoid cream if your doctor approves.  If you develop puffy, bulging veins (varicose veins) in your legs: ? Wear support hose or compression stockings as told by your doctor. ? Raise (elevate) your feet for 15 minutes, 3-4 times a day. ? Limit salt in your food. Safety  Wear your seat belt when driving.  Make a list of emergency phone numbers, including numbers for family, friends, the hospital, and police and fire departments. Preparing for your baby's arrival To prepare for the arrival of your baby:  Take prenatal classes.  Practice driving to the hospital.  Visit the hospital and tour the maternity area.  Talk to your work about taking leave once the baby comes.  Pack your  hospital bag.  Prepare the baby's room.  Go to your doctor visits.  Buy a rear-facing car seat. Learn how to install it in your car. General instructions  Do not use hot tubs, steam rooms, or saunas.  Do not use any products that contain nicotine or tobacco, such as cigarettes and e-cigarettes. If you need help quitting, ask your doctor.  Do not drink alcohol.  Do not douche or use tampons or scented sanitary pads.  Do not cross your legs for long periods of time.  Do not travel for long distances unless you must. Only do so if your doctor says it is okay.  Visit your dentist if you have not gone during your pregnancy. Use a soft toothbrush to brush your teeth. Be gentle when you floss.  Avoid cat litter boxes and soil used by cats. These carry germs that can cause birth  defects in the baby and can cause a loss of your baby (miscarriage) or stillbirth.  Keep all your prenatal visits as told by your doctor. This is important. Contact a doctor if:  You are not sure if you are in labor or if your water has broken.  You are dizzy.  You have mild cramps or pressure in your lower belly.  You have a nagging pain in your belly area.  You continue to feel sick to your stomach, you throw up, or you have watery poop.  You have bad smelling fluid coming from your vagina.  You have pain when you pee. Get help right away if:  You have a fever.  You are leaking fluid from your vagina.  You are spotting or bleeding from your vagina.  You have severe belly cramps or pain.  You lose or gain weight quickly.  You have trouble catching your breath and have chest pain.  You notice sudden or extreme puffiness (swelling) of your face, hands, ankles, feet, or legs.  You have not felt the baby move in over an hour.  You have severe headaches that do not go away with medicine.  You have trouble seeing.  You are leaking, or you are having a gush of fluid, from your vagina before  you are 37 weeks.  You have regular belly spasms (contractions) before you are 37 weeks. Summary  The third trimester is from week 28 through week 40 (months 7 through 9). This time is when your unborn baby is growing very fast.  Follow your doctor's advice about medicine, food, and activity.  Get ready for the arrival of your baby by taking prenatal classes, getting all the baby items ready, preparing the baby's room, and visiting your doctor to be checked.  Get help right away if you are bleeding from your vagina, or you have chest pain and trouble catching your breath, or if you have not felt your baby move in over an hour. This information is not intended to replace advice given to you by your health care provider. Make sure you discuss any questions you have with your health care provider. Document Revised: 07/30/2018 Document Reviewed: 05/14/2016 Elsevier Patient Education  2020 [employer].

## 2019-05-25 ENCOUNTER — Telehealth: Payer: Medicaid Other | Admitting: Advanced Practice Midwife

## 2019-05-25 ENCOUNTER — Telehealth: Payer: Self-pay

## 2019-05-25 DIAGNOSIS — O09899 Supervision of other high risk pregnancies, unspecified trimester: Secondary | ICD-10-CM

## 2019-05-25 NOTE — Telephone Encounter (Signed)
Called to begin patient virtual visit. - left message. Armandina Stammer RN

## 2019-05-27 ENCOUNTER — Telehealth: Payer: Self-pay

## 2019-05-27 ENCOUNTER — Telehealth: Payer: Medicaid Other | Admitting: Family Medicine

## 2019-05-27 NOTE — Telephone Encounter (Signed)
Patient called to start visit- patient didn't answer. Armandina Stammer RN

## 2019-06-10 DIAGNOSIS — O0990 Supervision of high risk pregnancy, unspecified, unspecified trimester: Secondary | ICD-10-CM | POA: Diagnosis not present

## 2019-07-09 ENCOUNTER — Other Ambulatory Visit: Payer: Self-pay

## 2019-07-09 ENCOUNTER — Other Ambulatory Visit (HOSPITAL_COMMUNITY)
Admission: RE | Admit: 2019-07-09 | Discharge: 2019-07-09 | Disposition: A | Discharge status: Home / Self Care | Payer: Medicaid Other | Source: Ambulatory Visit | Attending: Obstetrics & Gynecology | Admitting: Obstetrics & Gynecology

## 2019-07-09 ENCOUNTER — Ambulatory Visit (INDEPENDENT_AMBULATORY_CARE_PROVIDER_SITE_OTHER): Payer: Medicaid Other | Admitting: Obstetrics & Gynecology

## 2019-07-09 VITALS — BP 136/84 | HR 90 | Wt 262.0 lb

## 2019-07-09 DIAGNOSIS — O09292 Supervision of pregnancy with other poor reproductive or obstetric history, second trimester: Secondary | ICD-10-CM

## 2019-07-09 DIAGNOSIS — O09899 Supervision of other high risk pregnancies, unspecified trimester: Secondary | ICD-10-CM

## 2019-07-09 DIAGNOSIS — O09529 Supervision of elderly multigravida, unspecified trimester: Secondary | ICD-10-CM | POA: Diagnosis not present

## 2019-07-09 DIAGNOSIS — O0930 Supervision of pregnancy with insufficient antenatal care, unspecified trimester: Secondary | ICD-10-CM | POA: Diagnosis not present

## 2019-07-09 DIAGNOSIS — B3731 Acute candidiasis of vulva and vagina: Secondary | ICD-10-CM

## 2019-07-09 DIAGNOSIS — Z98891 History of uterine scar from previous surgery: Secondary | ICD-10-CM

## 2019-07-09 DIAGNOSIS — O09523 Supervision of elderly multigravida, third trimester: Secondary | ICD-10-CM

## 2019-07-09 DIAGNOSIS — O09293 Supervision of pregnancy with other poor reproductive or obstetric history, third trimester: Secondary | ICD-10-CM

## 2019-07-09 DIAGNOSIS — B373 Candidiasis of vulva and vagina: Secondary | ICD-10-CM

## 2019-07-09 DIAGNOSIS — Z8759 Personal history of other complications of pregnancy, childbirth and the puerperium: Secondary | ICD-10-CM

## 2019-07-09 DIAGNOSIS — Z3A35 35 weeks gestation of pregnancy: Secondary | ICD-10-CM

## 2019-07-09 DIAGNOSIS — O34219 Maternal care for unspecified type scar from previous cesarean delivery: Secondary | ICD-10-CM

## 2019-07-09 DIAGNOSIS — O0933 Supervision of pregnancy with insufficient antenatal care, third trimester: Secondary | ICD-10-CM

## 2019-07-09 MED ORDER — TERCONAZOLE 0.4 % VA CREA: 1.0000 | TOPICAL_CREAM | Freq: Every day | VAGINAL | 0 refills | Status: DC

## 2019-07-09 NOTE — Progress Notes (Signed)
   PRENATAL VISIT NOTE  Subjective:  Kayla Krueger is a 39 y.o. [id number] at [redacted]w[redacted]d being seen today for ongoing prenatal care.  She is currently monitored for the following issues for this high-risk pregnancy and has Supervision of other high risk pregnancy, antepartum; History of stillbirth in currently pregnant patient, second trimester; History of cesarean section; History of gestational hypertension; Advanced maternal age in multigravida; and Limited prenatal care on their problem list.  Patient reports pain in her hips. .  Contractions: Not present. Vag. Bleeding: None.  Movement: Present. Denies leaking of fluid.   The following portions of the patient's history were reviewed and updated as appropriate: allergies, current medications, past family history, past medical history, past social history, past surgical history and problem list.   Objective:   Vitals:   07/09/19 0820  BP: 136/84  Pulse: 90  Weight: 262 lb (118.8 kg)    Fetal Status: Fetal Heart Rate (bpm): 150   Movement: Present     General:  Alert, oriented and cooperative. Patient is in no acute distress.  Skin: Skin is warm and dry. No rash noted.   Cardiovascular: Normal heart rate noted  Respiratory: Normal respiratory effort, no problems with respiration noted  Abdomen: Soft, gravid, appropriate for gestational age.  Pain/Pressure: Present     Pelvic: Cervical exam completed  Extremities: Normal range of motion.  Edema: Trace  Mental Status: Normal mood and affect. Normal behavior. Normal judgment and thought content.   Assessment and Plan:  Pregnancy: [id number] at [redacted]w[redacted]d 1. Supervision of other high risk pregnancy, antepartum  - GC/Chlamydia probe amp ([place])not at [facility] - Culture, beta strep (group b only)  2. History of stillbirth in currently pregnant patient, second trimester Limited PNC. Encouraged visits  H/o GDM prev. Did not get glucose testing this pregnancy   3. History of cesarean section  For repeat at 39 weeks with BTL Sent to be scheduled. Title XIX papers on chart       4. Antepartum multigravida of advanced maternal age  32. History of gestational hypertension  6. Limited prenatal care, antepartum - GC/Chlamydia probe amp ([place])not at [facility] - Culture, beta strep (group b only)  Preterm labor symptoms and general obstetric precautions including but not limited to vaginal bleeding, contractions, leaking of fluid and fetal movement were reviewed in detail with the patient. Please refer to After Visit Summary for other counseling recommendations.   Return in about 1 week (around 07/16/2019).  No future appointments.  Willodean Rosenthal, MD

## 2019-07-10 LAB — CBC
Hematocrit: 34.4 % (ref 34.0–46.6)
Hemoglobin: 10.8 g/dL — ABNORMAL LOW (ref 11.1–15.9)
MCH: 26.2 pg — ABNORMAL LOW (ref 26.6–33.0)
MCHC: 31.4 g/dL — ABNORMAL LOW (ref 31.5–35.7)
MCV: 83 fL (ref 79–97)
Platelets: 357 10*3/uL (ref 150–450)
RBC: 4.13 x10E6/uL (ref 3.77–5.28)
RDW: 15.2 % (ref 11.7–15.4)
WBC: 5.9 10*3/uL (ref 3.4–10.8)

## 2019-07-10 LAB — RPR: RPR Ser Ql: NONREACTIVE

## 2019-07-10 LAB — HEMOGLOBIN A1C
Est. average glucose Bld gHb Est-mCnc: 137 mg/dL
Hgb A1c MFr Bld: 6.4 % — ABNORMAL HIGH (ref 4.8–5.6)

## 2019-07-10 LAB — GLUCOSE TOLERANCE, 1 HOUR: Glucose, 1Hr PP: 173 mg/dL (ref 65–199)

## 2019-07-10 LAB — HIV ANTIBODY (ROUTINE TESTING W REFLEX): HIV Screen 4th Generation wRfx: NONREACTIVE

## 2019-07-12 LAB — GC/CHLAMYDIA PROBE AMP (~~LOC~~) NOT AT ARMC
Chlamydia: NEGATIVE
Comment: NEGATIVE
Comment: NORMAL
Neisseria Gonorrhea: NEGATIVE

## 2019-07-13 LAB — CULTURE, BETA STREP (GROUP B ONLY): Strep Gp B Culture: NEGATIVE

## 2019-07-16 ENCOUNTER — Other Ambulatory Visit: Payer: Self-pay | Admitting: Family Medicine

## 2019-07-19 ENCOUNTER — Ambulatory Visit (INDEPENDENT_AMBULATORY_CARE_PROVIDER_SITE_OTHER): Payer: Medicaid Other | Admitting: Obstetrics & Gynecology

## 2019-07-19 ENCOUNTER — Other Ambulatory Visit: Payer: Self-pay

## 2019-07-19 VITALS — BP 140/85 | HR 94 | Wt 269.0 lb

## 2019-07-19 DIAGNOSIS — Z98891 History of uterine scar from previous surgery: Secondary | ICD-10-CM

## 2019-07-19 DIAGNOSIS — O09523 Supervision of elderly multigravida, third trimester: Secondary | ICD-10-CM

## 2019-07-19 DIAGNOSIS — O09899 Supervision of other high risk pregnancies, unspecified trimester: Secondary | ICD-10-CM

## 2019-07-19 DIAGNOSIS — O09893 Supervision of other high risk pregnancies, third trimester: Secondary | ICD-10-CM

## 2019-07-19 DIAGNOSIS — O0933 Supervision of pregnancy with insufficient antenatal care, third trimester: Secondary | ICD-10-CM

## 2019-07-19 DIAGNOSIS — Z3A36 36 weeks gestation of pregnancy: Secondary | ICD-10-CM

## 2019-07-19 DIAGNOSIS — O09292 Supervision of pregnancy with other poor reproductive or obstetric history, second trimester: Secondary | ICD-10-CM

## 2019-07-19 DIAGNOSIS — O09529 Supervision of elderly multigravida, unspecified trimester: Secondary | ICD-10-CM

## 2019-07-19 DIAGNOSIS — Z8759 Personal history of other complications of pregnancy, childbirth and the puerperium: Secondary | ICD-10-CM

## 2019-07-19 DIAGNOSIS — O0930 Supervision of pregnancy with insufficient antenatal care, unspecified trimester: Secondary | ICD-10-CM

## 2019-07-19 LAB — POCT URINALYSIS DIPSTICK OB: POC,PROTEIN,UA: NEGATIVE

## 2019-07-19 NOTE — Progress Notes (Signed)
   PRENATAL VISIT NOTE  Subjective:  Kayla Krueger is a 39 y.o. [id number] at [redacted]w[redacted]d being seen today for ongoing prenatal care.  She is currently monitored for the following issues for this high-risk pregnancy and has Supervision of other high risk pregnancy, antepartum; History of stillbirth in currently pregnant patient, second trimester; History of cesarean section; History of gestational hypertension; Advanced maternal age in multigravida; and Limited prenatal care on their problem list.  Patient reports no complaints.  Contractions: Not present. Vag. Bleeding: None.  Movement: Present. Denies leaking of fluid.   The following portions of the patient's history were reviewed and updated as appropriate: allergies, current medications, past family history, past medical history, past social history, past surgical history and problem list.   Objective:   Vitals:   07/19/19 1104  Weight: 269 lb (122 kg)    Fetal Status:     Movement: Present     General:  Alert, oriented and cooperative. Patient is in no acute distress.  Skin: Skin is warm and dry. No rash noted.   Cardiovascular: Normal heart rate noted  Respiratory: Normal respiratory effort, no problems with respiration noted  Abdomen: Soft, gravid, appropriate for gestational age.  Pain/Pressure: Present     Pelvic: Cervical exam deferred        Extremities: Normal range of motion.  Edema: Trace  Mental Status: Normal mood and affect. Normal behavior. Normal judgment and thought content.   Assessment and Plan:  Pregnancy: [id number] at [redacted]w[redacted]d 1. Supervision of other high risk pregnancy, antepartum FH and FM WNL  Pt signed VBAC consent.   2. Limited prenatal care, antepartum  3. History of stillbirth in currently pregnant patient, second trimester  4. History of cesarean section VBAC consenrt signed today she has had 2 prev c/s  For repeat with BTL on 08/03/2019  BTL papers signed last visit  5. Antepartum multigravida of  advanced maternal age  56. History of gestational hypertension Did not get started on baby ASA due to limited pnc.   Preterm labor symptoms and general obstetric precautions including but not limited to vaginal bleeding, contractions, leaking of fluid and fetal movement were reviewed in detail with the patient. Please refer to After Visit Summary for other counseling recommendations.   Return in about 1 week (around 07/26/2019) for in person.  No future appointments.  Willodean Rosenthal, MD

## 2019-07-19 NOTE — Patient Instructions (Signed)
Cesarean Delivery Cesarean birth, or cesarean delivery, is the surgical delivery of a baby through an incision in the abdomen and the uterus. This may be referred to as a C-section. This procedure may be scheduled ahead of time, or it may be done in an emergency situation. Tell a health care provider about:  Any allergies you have.  All medicines you are taking, including vitamins, herbs, eye drops, creams, and over-the-counter medicines.  Any problems you or family members have had with anesthetic medicines.  Any blood disorders you have.  Any surgeries you have had.  Any medical conditions you have.  Whether you or any members of your family have a history of deep vein thrombosis (DVT) or pulmonary embolism (PE). What are the risks? Generally, this is a safe procedure. However, problems may occur, including:  Infection.  Bleeding.  Allergic reactions to medicines.  Damage to other structures or organs.  Blood clots.  Injury to your baby. What happens before the procedure? General instructions  Follow instructions from your health care provider about eating or drinking restrictions.  If you know that you are going to have a cesarean delivery, do not shave your pubic area. Shaving before the procedure may increase your risk of infection.  Plan to have someone take you home from the hospital.  Ask your health care provider what steps will be taken to prevent infection. These may include: ? Removing hair at the surgery site. ? Washing skin with a germ-killing soap. ? Taking antibiotic medicine.  Depending on the reason for your cesarean delivery, you may have a physical exam or additional testing, such as an ultrasound.  You may have your blood or urine tested. Questions for your health care provider  Ask your health care provider about: ? Changing or stopping your regular medicines. This is especially important if you are taking diabetes medicines or blood  thinners. ? Your pain management plan. This is especially important if you plan to breastfeed your baby. ? How long you will be in the hospital after the procedure. ? Any concerns you may have about receiving blood products, if you need them during the procedure. ? Cord blood banking, if you plan to collect your baby's umbilical cord blood.  You may also want to ask your health care provider: ? Whether you will be able to hold or breastfeed your baby while you are still in the operating room. ? Whether your baby can stay with you immediately after the procedure and during your recovery. ? Whether a family member or a person of your choice can go with you into the operating room and stay with you during the procedure, immediately after the procedure, and during your recovery. What happens during the procedure?   An IV will be inserted into one of your veins.  Fluid and medicines, such as antibiotics, will be given before the surgery.  Fetal monitors will be placed on your abdomen to check your baby's heart rate.  You may be given a special warming gown to wear to keep your temperature stable.  A catheter may be inserted into your bladder through your urethra. This drains your urine during the procedure.  You may be given one or more of the following: ? A medicine to numb the area (local anesthetic). ? A medicine to make you fall asleep (general anesthetic). ? A medicine (regional anesthetic) that is injected into your back or through a small thin tube placed in your back (spinal anesthetic or epidural anesthetic).   This numbs everything below the injection site and allows you to stay awake during your procedure. If this makes you feel nauseous, tell your health care provider. Medicines will be available to help reduce any nausea you may feel.  An incision will be made in your abdomen, and then in your uterus.  If you are awake during your procedure, you may feel tugging and pulling in  your abdomen, but you should not feel pain. If you feel pain, tell your health care provider immediately.  Your baby will be removed from your uterus. You may feel more pressure or pushing while this happens.  Immediately after birth, your baby will be dried and kept warm. You may be able to hold and breastfeed your baby.  The umbilical cord may be clamped and cut during this time. This usually occurs after waiting a period of 1-2 minutes after delivery.  Your placenta will be removed from your uterus.  Your incisions will be closed with stitches (sutures). Staples, skin glue, or adhesive strips may also be applied to the incision in your abdomen.  Bandages (dressings) may be placed over the incision in your abdomen. The procedure may vary among health care providers and hospitals. What happens after the procedure?  Your blood pressure, heart rate, breathing rate, and blood oxygen level will be monitored until you are discharged from the hospital.  You may continue to receive fluids and medicines through an IV.  You will have some pain. Medicines will be available to help control your pain.  To help prevent blood clots: ? You may be given medicines. ? You may have to wear compression stockings or devices. ? You will be encouraged to walk around when you are able.  Hospital staff will encourage and support bonding with your baby. Your hospital may have you and your baby to stay in the same room (rooming in) during your hospital stay to encourage successful bonding and breastfeeding.  You may be encouraged to cough and breathe deeply often. This helps to prevent lung problems.  If you have a catheter draining your urine, it will be removed as soon as possible after your procedure. Summary  Cesarean birth, or cesarean delivery, is the surgical delivery of a baby through an incision in the abdomen and the uterus.  Follow instructions from your health care provider about eating or  drinking restrictions before the procedure.  You will have some pain after the procedure. Medicines will be available to help control your pain.  Hospital staff will encourage and support bonding with your baby after the procedure. Your hospital may have you and your baby to stay in the same room (rooming in) during your hospital stay to encourage successful bonding and breastfeeding. This information is not intended to replace advice given to you by your health care provider. Make sure you discuss any questions you have with your health care provider. Document Revised: 10/13/2017 Document Reviewed: 10/13/2017 Elsevier Patient Education  2020 [employer]  

## 2019-07-19 NOTE — Addendum Note (Signed)
Addended by: Anell Barr on: 07/19/2019 12:41 PM   Modules accepted: Orders

## 2019-07-20 NOTE — Patient Instructions (Signed)
Jazzie Trampe  07/20/2019   Your procedure is scheduled on:  08/03/2019  Arrive at 0745 at Entrance C on [address] at [facility]  and [employer]. You are invited to use the FREE valet parking or use the Visitor's parking deck.  Pick up the phone at the desk and dial [phone].  Call this number if you have problems the morning of surgery: [phone]  Remember:   Do not eat food:(After Midnight) Desps de medianoche.  Do not drink clear liquids: (After Midnight) Desps de medianoche.  Take these medicines the morning of surgery with A SIP OF WATER:  none   Do not wear jewelry, make-up or nail polish.  Do not wear lotions, powders, or perfumes. Do not wear deodorant.  Do not shave 48 hours prior to surgery.  Do not bring valuables to the hospital.  [facility] is not   responsible for any belongings or valuables brought to the hospital.  Contacts, dentures or bridgework may not be worn into surgery.  Leave suitcase in the car. After surgery it may be brought to your room.  For patients admitted to the hospital, checkout time is 11:00 AM the day of              discharge.      Please read over the following fact sheets that you were given:     Preparing for Surgery

## 2019-07-22 ENCOUNTER — Telehealth (HOSPITAL_COMMUNITY): Payer: Self-pay | Admitting: *Deleted

## 2019-07-22 NOTE — Telephone Encounter (Signed)
Preadmission screen  

## 2019-07-26 ENCOUNTER — Telehealth (HOSPITAL_COMMUNITY): Payer: Self-pay | Admitting: *Deleted

## 2019-07-26 NOTE — Telephone Encounter (Signed)
Preadmission screen  

## 2019-07-27 ENCOUNTER — Other Ambulatory Visit: Payer: Self-pay

## 2019-07-27 ENCOUNTER — Ambulatory Visit (INDEPENDENT_AMBULATORY_CARE_PROVIDER_SITE_OTHER): Payer: Medicaid Other | Admitting: Advanced Practice Midwife

## 2019-07-27 ENCOUNTER — Encounter (HOSPITAL_COMMUNITY): Payer: Self-pay

## 2019-07-27 ENCOUNTER — Encounter: Payer: Self-pay | Admitting: Advanced Practice Midwife

## 2019-07-27 VITALS — BP 125/81 | HR 100 | Wt 262.0 lb

## 2019-07-27 DIAGNOSIS — O09292 Supervision of pregnancy with other poor reproductive or obstetric history, second trimester: Secondary | ICD-10-CM

## 2019-07-27 DIAGNOSIS — Z98891 History of uterine scar from previous surgery: Secondary | ICD-10-CM

## 2019-07-27 DIAGNOSIS — O09293 Supervision of pregnancy with other poor reproductive or obstetric history, third trimester: Secondary | ICD-10-CM

## 2019-07-27 DIAGNOSIS — Z3A38 38 weeks gestation of pregnancy: Secondary | ICD-10-CM

## 2019-07-27 DIAGNOSIS — O3429 Maternal care due to uterine scar from other previous surgery: Secondary | ICD-10-CM

## 2019-07-27 DIAGNOSIS — O09899 Supervision of other high risk pregnancies, unspecified trimester: Secondary | ICD-10-CM

## 2019-07-27 NOTE — Patient Instructions (Signed)
Cesarean Delivery, Care After This sheet gives you information about how to care for yourself after your procedure. Your health care provider may also give you more specific instructions. If you have problems or questions, contact your health care provider. What can I expect after the procedure? After the procedure, it is common to have:  A small amount of blood or clear fluid coming from the incision.  Some redness, swelling, and pain in your incision area.  Some abdominal pain and soreness.  Vaginal bleeding (lochia). Even though you did not have a vaginal delivery, you will still have vaginal bleeding and discharge.  Pelvic cramps.  Fatigue. You may have pain, swelling, and discomfort in the tissue between your vagina and your anus (perineum) if:  Your C-section was unplanned, and you were allowed to labor and push.  An incision was made in the area (episiotomy) or the tissue tore during attempted vaginal delivery. Follow these instructions at home: Incision care   Follow instructions from your health care provider about how to take care of your incision. Make sure you: ? Wash your hands with soap and water before you change your bandage (dressing). If soap and water are not available, use hand sanitizer. ? If you have a dressing, change it or remove it as told by your health care provider. ? Leave stitches (sutures), skin staples, skin glue, or adhesive strips in place. These skin closures may need to stay in place for 2 weeks or longer. If adhesive strip edges start to loosen and curl up, you may trim the loose edges. Do not remove adhesive strips completely unless your health care provider tells you to do that.  Check your incision area every day for signs of infection. Check for: ? More redness, swelling, or pain. ? More fluid or blood. ? Warmth. ? Pus or a bad smell.  Do not take baths, swim, or use a hot tub until your health care provider says it's okay. Ask your health  care provider if you can take showers.  When you cough or sneeze, hug a pillow. This helps with pain and decreases the chance of your incision opening up (dehiscing). Do this until your incision heals. Medicines  Take over-the-counter and prescription medicines only as told by your health care provider.  If you were prescribed an antibiotic medicine, take it as told by your health care provider. Do not stop taking the antibiotic even if you start to feel better.  Do not drive or use heavy machinery while taking prescription pain medicine. Lifestyle  Do not drink alcohol. This is especially important if you are breastfeeding or taking pain medicine.  Do not use any products that contain nicotine or tobacco, such as cigarettes, e-cigarettes, and chewing tobacco. If you need help quitting, ask your health care provider. Eating and drinking  Drink at least 8 eight-ounce glasses of water every day unless told not to by your health care provider. If you breastfeed, you may need to drink even more water.  Eat high-fiber foods every day. These foods may help prevent or relieve constipation. High-fiber foods include: ? Whole grain cereals and breads. ? Brown rice. ? Beans. ? Fresh fruits and vegetables. Activity   If possible, have someone help you care for your baby and help with household activities for at least a few days after you leave the hospital.  Return to your normal activities as told by your health care provider. Ask your health care provider what activities are safe for   you.  Rest as much as possible. Try to rest or take a nap while your baby is sleeping.  Do not lift anything that is heavier than 10 lbs (4.5 kg), or the limit that you were told, until your health care provider says that it is safe.  Talk with your health care provider about when you can engage in sexual activity. This may depend on your: ? Risk of infection. ? How fast you heal. ? Comfort and desire to  engage in sexual activity. General instructions  Do not use tampons or douches until your health care provider approves.  Wear loose, comfortable clothing and a supportive and well-fitting bra.  Keep your perineum clean and dry. Wipe from front to back when you use the toilet.  If you pass a blood clot, save it and call your health care provider to discuss. Do not flush blood clots down the toilet before you get instructions from your health care provider.  Keep all follow-up visits for you and your baby as told by your health care provider. This is important. Contact a health care provider if:  You have: ? A fever. ? Bad-smelling vaginal discharge. ? Pus or a bad smell coming from your incision. ? Difficulty or pain when urinating. ? A sudden increase or decrease in the frequency of your bowel movements. ? More redness, swelling, or pain around your incision. ? More fluid or blood coming from your incision. ? A rash. ? Nausea. ? Little or no interest in activities you used to enjoy. ? Questions about caring for yourself or your baby.  Your incision feels warm to the touch.  Your breasts turn red or become painful or hard.  You feel unusually sad or worried.  You vomit.  You pass a blood clot from your vagina.  You urinate more than usual.  You are dizzy or light-headed. Get help right away if:  You have: ? Pain that does not go away or get better with medicine. ? Chest pain. ? Difficulty breathing. ? Blurred vision or spots in your vision. ? Thoughts about hurting yourself or your baby. ? New pain in your abdomen or in one of your legs. ? A severe headache.  You faint.  You bleed from your vagina so much that you fill more than one sanitary pad in one hour. Bleeding should not be heavier than your heaviest period. Summary  After the procedure, it is common to have pain at your incision site, abdominal cramping, and slight bleeding from your vagina.  Check  your incision area every day for signs of infection.  Tell your health care provider about any unusual symptoms.  Keep all follow-up visits for you and your baby as told by your health care provider. This information is not intended to replace advice given to you by your health care provider. Make sure you discuss any questions you have with your health care provider. Document Revised: 10/15/2017 Document Reviewed: 10/15/2017 Elsevier Patient Education  2020 [employer]  

## 2019-07-27 NOTE — Progress Notes (Signed)
   PRENATAL VISIT NOTE  Subjective:  Kayla Krueger is a 39 y.o. [id number] at [redacted]w[redacted]d being seen today for ongoing prenatal care.  She is currently monitored for the following issues for this high-risk pregnancy and has Supervision of other high risk pregnancy, antepartum; History of stillbirth in currently pregnant patient, second trimester; History of cesarean section; History of gestational hypertension; Advanced maternal age in multigravida; and Limited prenatal care on their problem list.  Patient reports occasional contractions.  Contractions: Irregular. Vag. Bleeding: None.  Movement: Present. Denies leaking of fluid.   The following portions of the patient's history were reviewed and updated as appropriate: allergies, current medications, past family history, past medical history, past social history, past surgical history and problem list.   Objective:   Vitals:   07/27/19 0959  BP: 125/81  Pulse: 100  Weight: 262 lb (118.8 kg)    Fetal Status:     Movement: Present     General:  Alert, oriented and cooperative. Patient is in no acute distress.  Skin: Skin is warm and dry. No rash noted.   Cardiovascular: Normal heart rate noted  Respiratory: Normal respiratory effort, no problems with respiration noted  Abdomen: Soft, gravid, appropriate for gestational age.  Pain/Pressure: Present     Pelvic: Cervical exam deferred        Extremities: Normal range of motion.  Edema: Trace  Mental Status: Normal mood and affect. Normal behavior. Normal judgment and thought content.   Assessment and Plan:  Pregnancy: [id number] at [redacted]w[redacted]d .1. Supervision of other high risk pregnancy, antepartum FH and FM WNL  Pt signed VBAC consent at prior visit  2. Limited prenatal care, antepartum  3. History of stillbirth in currently pregnant patient, second trimester     Good fetal movement 4. History of cesarean section For repeat with BTL on 08/03/2019  BTL papers signed last visit Anxious to get  delivered Return two weeks postop for incision check  5. Antepartum multigravida of advanced maternal age  71. History of gestational hypertension Did not get started on baby ASA due to limited pnc.   Term labor symptoms and general obstetric precautions including but not limited to vaginal bleeding, contractions, leaking of fluid and fetal movement were reviewed in detail with the patient. Please refer to After Visit Summary for other counseling recommendations.   Return in about 2 weeks (around 08/10/2019).  Future Appointments  Date Time Provider Department Center  08/01/2019  8:30 AM [facility] None  08/19/2019 11:00 AM Levie Heritage, DO [facility] None    Wynelle Bourgeois, CNM

## 2019-08-01 ENCOUNTER — Other Ambulatory Visit: Payer: Self-pay

## 2019-08-01 ENCOUNTER — Other Ambulatory Visit (HOSPITAL_COMMUNITY)
Admission: RE | Admit: 2019-08-01 | Discharge: 2019-08-01 | Disposition: A | Discharge status: Home / Self Care | Payer: Medicaid Other | Source: Ambulatory Visit | Attending: Obstetrics and Gynecology | Admitting: Obstetrics and Gynecology

## 2019-08-01 DIAGNOSIS — Z20822 Contact with and (suspected) exposure to covid-19: Secondary | ICD-10-CM | POA: Diagnosis present

## 2019-08-01 LAB — COMPREHENSIVE METABOLIC PANEL
ALT: 18 U/L (ref 0–44)
AST: 21 U/L (ref 15–41)
Albumin: 2.3 g/dL — ABNORMAL LOW (ref 3.5–5.0)
Alkaline Phosphatase: 212 U/L — ABNORMAL HIGH (ref 38–126)
Anion gap: 9 (ref 5–15)
BUN: 8 mg/dL (ref 6–20)
CO2: 22 mmol/L (ref 22–32)
Calcium: 9.3 mg/dL (ref 8.9–10.3)
Chloride: 106 mmol/L (ref 98–111)
Creatinine, Ser: 0.77 mg/dL (ref 0.44–1.00)
GFR calc Af Amer: >60 mL/min (ref >60)
GFR calc non Af Amer: >60 mL/min (ref >60)
Glucose, Bld: 87 mg/dL (ref 70–99)
Potassium: 4.2 mmol/L (ref 3.5–5.1)
Sodium: 137 mmol/L (ref 135–145)
Total Bilirubin: 0.6 mg/dL (ref 0.3–1.2)
Total Protein: 6.6 g/dL (ref 6.5–8.1)

## 2019-08-01 LAB — CBC
HCT: 36 % (ref 36.0–46.0)
Hemoglobin: 11.3 g/dL — ABNORMAL LOW (ref 12.0–15.0)
MCH: 25.6 pg — ABNORMAL LOW (ref 26.0–34.0)
MCHC: 31.4 g/dL (ref 30.0–36.0)
MCV: 81.4 fL (ref 80.0–100.0)
Platelets: 355 10*3/uL (ref 150–400)
RBC: 4.42 MIL/uL (ref 3.87–5.11)
RDW: 17.2 % — ABNORMAL HIGH (ref 11.5–15.5)
WBC: 6.2 10*3/uL (ref 4.0–10.5)
nRBC: 0 % (ref 0.0–0.2)

## 2019-08-01 LAB — RPR: RPR Ser Ql: NONREACTIVE

## 2019-08-01 LAB — SARS CORONAVIRUS 2 (TAT 6-24 HRS): SARS Coronavirus 2: NEGATIVE

## 2019-08-01 NOTE — MAU Note (Signed)
Pt here for PAT covid swab and lab draw. Denies symptoms or sick contacts. Swab collected. 

## 2019-08-02 LAB — TYPE AND SCREEN
ABO/RH(D): O POS
Antibody Screen: NEGATIVE

## 2019-08-02 LAB — ABO/RH: ABO/RH(D): O POS

## 2019-08-03 ENCOUNTER — Inpatient Hospital Stay (HOSPITAL_COMMUNITY): Payer: Medicaid Other | Admitting: Certified Registered Nurse Anesthetist

## 2019-08-03 ENCOUNTER — Other Ambulatory Visit: Payer: Self-pay

## 2019-08-03 ENCOUNTER — Encounter (HOSPITAL_COMMUNITY)
Admission: RE | Disposition: A | Discharge status: Home / Self Care | Payer: Self-pay | Source: Home / Self Care | Attending: Family Medicine

## 2019-08-03 ENCOUNTER — Encounter (HOSPITAL_COMMUNITY): Payer: Self-pay | Admitting: Family Medicine

## 2019-08-03 ENCOUNTER — Inpatient Hospital Stay (HOSPITAL_COMMUNITY)
Admission: RE | Admit: 2019-08-03 | Discharge: 2019-08-05 | DRG: 785 | Disposition: A | Discharge status: Home / Self Care | Payer: Medicaid Other | Attending: Family Medicine | Admitting: Family Medicine

## 2019-08-03 DIAGNOSIS — Z87891 Personal history of nicotine dependence: Secondary | ICD-10-CM | POA: Diagnosis not present

## 2019-08-03 DIAGNOSIS — Z9079 Acquired absence of other genital organ(s): Secondary | ICD-10-CM

## 2019-08-03 DIAGNOSIS — Z302 Encounter for sterilization: Secondary | ICD-10-CM

## 2019-08-03 DIAGNOSIS — O09899 Supervision of other high risk pregnancies, unspecified trimester: Secondary | ICD-10-CM

## 2019-08-03 DIAGNOSIS — O34211 Maternal care for low transverse scar from previous cesarean delivery: Secondary | ICD-10-CM | POA: Diagnosis not present

## 2019-08-03 DIAGNOSIS — O34219 Maternal care for unspecified type scar from previous cesarean delivery: Secondary | ICD-10-CM

## 2019-08-03 DIAGNOSIS — Z3A39 39 weeks gestation of pregnancy: Secondary | ICD-10-CM | POA: Diagnosis not present

## 2019-08-03 DIAGNOSIS — O99214 Obesity complicating childbirth: Secondary | ICD-10-CM | POA: Diagnosis present

## 2019-08-03 DIAGNOSIS — Z98891 History of uterine scar from previous surgery: Secondary | ICD-10-CM

## 2019-08-03 LAB — GLUCOSE, CAPILLARY
Glucose-Capillary: 95 mg/dL (ref 70–99)
Glucose-Capillary: 91 mg/dL (ref 70–99)

## 2019-08-03 SURGERY — Surgical Case
Anesthesia: Spinal | Site: Abdomen | Wound class: Clean Contaminated

## 2019-08-03 MED ORDER — NALBUPHINE HCL 10 MG/ML IJ SOLN: 5.0000 mg | Freq: Once | INTRAMUSCULAR | Status: DC | PRN

## 2019-08-03 MED ORDER — PRENATAL MULTIVITAMIN CH
1.0000 | ORAL_TABLET | Freq: Every day | ORAL | Status: DC
  Administered 2019-08-04: 12:00:00 1 via ORAL
  Filled 2019-08-03: qty 1

## 2019-08-03 MED ORDER — FENTANYL CITRATE (PF) 100 MCG/2ML IJ SOLN: 25.0000 ug | INTRAMUSCULAR | Status: DC | PRN

## 2019-08-03 MED ORDER — SOD CITRATE-CITRIC ACID 500-334 MG/5ML PO SOLN
30.0000 mL | ORAL | Status: AC
  Administered 2019-08-03: 30 mL via ORAL

## 2019-08-03 MED ORDER — BUPIVACAINE IN DEXTROSE 0.75-8.25 % IT SOLN
INTRATHECAL | Status: DC | PRN
  Administered 2019-08-03: 1.6 mL via INTRATHECAL

## 2019-08-03 MED ORDER — ONDANSETRON HCL 4 MG/2ML IJ SOLN: 4.0000 mg | Freq: Three times a day (TID) | INTRAMUSCULAR | Status: DC | PRN

## 2019-08-03 MED ORDER — SIMETHICONE 80 MG PO CHEW
80.0000 mg | CHEWABLE_TABLET | Freq: Three times a day (TID) | ORAL | Status: DC
  Administered 2019-08-03 – 2019-08-04 (×4): 80 mg via ORAL
  Filled 2019-08-03 (×4): qty 1

## 2019-08-03 MED ORDER — KETOROLAC TROMETHAMINE 30 MG/ML IJ SOLN
30.0000 mg | Freq: Four times a day (QID) | INTRAMUSCULAR | Status: AC | PRN
  Administered 2019-08-03: 11:00:00 30 mg via INTRAMUSCULAR

## 2019-08-03 MED ORDER — ONDANSETRON HCL 4 MG/2ML IJ SOLN
INTRAMUSCULAR | Status: AC
  Filled 2019-08-03: qty 2

## 2019-08-03 MED ORDER — WITCH HAZEL-GLYCERIN EX PADS: 1.0000 "application " | MEDICATED_PAD | CUTANEOUS | Status: DC | PRN

## 2019-08-03 MED ORDER — MORPHINE SULFATE (PF) 0.5 MG/ML IJ SOLN
INTRAMUSCULAR | Status: DC | PRN
  Administered 2019-08-03: .15 ug via INTRATHECAL

## 2019-08-03 MED ORDER — LACTATED RINGERS IV SOLN: INTRAVENOUS | Status: DC

## 2019-08-03 MED ORDER — PHENYLEPHRINE HCL (PRESSORS) 10 MG/ML IV SOLN
INTRAVENOUS | Status: DC | PRN
  Administered 2019-08-03: 120 ug via INTRAVENOUS

## 2019-08-03 MED ORDER — DIPHENHYDRAMINE HCL 50 MG/ML IJ SOLN: 12.5000 mg | INTRAMUSCULAR | Status: DC | PRN

## 2019-08-03 MED ORDER — FENTANYL CITRATE (PF) 100 MCG/2ML IJ SOLN
INTRAMUSCULAR | Status: DC | PRN
  Administered 2019-08-03: 15 ug via INTRATHECAL

## 2019-08-03 MED ORDER — SOD CITRATE-CITRIC ACID 500-334 MG/5ML PO SOLN
ORAL | Status: AC
  Filled 2019-08-03: qty 30

## 2019-08-03 MED ORDER — ENOXAPARIN SODIUM 60 MG/0.6ML ~~LOC~~ SOLN
0.5000 mg/kg | SUBCUTANEOUS | Status: DC
  Administered 2019-08-04: 60 mg via SUBCUTANEOUS
  Filled 2019-08-03: qty 0.6

## 2019-08-03 MED ORDER — OXYTOCIN 10 UNIT/ML IJ SOLN
INTRAMUSCULAR | Status: DC | PRN
  Administered 2019-08-03: 40 [IU]

## 2019-08-03 MED ORDER — FENTANYL CITRATE (PF) 100 MCG/2ML IJ SOLN
INTRAMUSCULAR | Status: AC
  Filled 2019-08-03: qty 2

## 2019-08-03 MED ORDER — SODIUM CHLORIDE 0.9 % IR SOLN
Status: DC | PRN
  Administered 2019-08-03: 1

## 2019-08-03 MED ORDER — STERILE WATER FOR IRRIGATION IR SOLN
Status: DC | PRN
  Administered 2019-08-03: 1

## 2019-08-03 MED ORDER — SODIUM CHLORIDE 0.9% FLUSH: 3.0000 mL | INTRAVENOUS | Status: DC | PRN

## 2019-08-03 MED ORDER — NALBUPHINE HCL 10 MG/ML IJ SOLN: 5.0000 mg | INTRAMUSCULAR | Status: DC | PRN

## 2019-08-03 MED ORDER — TRANEXAMIC ACID 1000 MG/10ML IV SOLN
INTRAVENOUS | Status: DC | PRN
  Administered 2019-08-03: 1000 mg via INTRAVENOUS

## 2019-08-03 MED ORDER — CEFAZOLIN SODIUM-DEXTROSE 2-4 GM/100ML-% IV SOLN
2.0000 g | INTRAVENOUS | Status: AC
  Administered 2019-08-03: 2 g via INTRAVENOUS

## 2019-08-03 MED ORDER — PHENYLEPHRINE HCL-NACL 20-0.9 MG/250ML-% IV SOLN
INTRAVENOUS | Status: DC | PRN
  Administered 2019-08-03: 60 ug/min via INTRAVENOUS
  Administered 2019-08-03: 100 ug/min via INTRAVENOUS

## 2019-08-03 MED ORDER — COCONUT OIL OIL: 1.0000 "application " | TOPICAL_OIL | Status: DC | PRN

## 2019-08-03 MED ORDER — EPHEDRINE SULFATE 50 MG/ML IJ SOLN
INTRAMUSCULAR | Status: DC | PRN
  Administered 2019-08-03: 10 mg via INTRAVENOUS

## 2019-08-03 MED ORDER — DIBUCAINE (PERIANAL) 1 % EX OINT: 1.0000 "application " | TOPICAL_OINTMENT | CUTANEOUS | Status: DC | PRN

## 2019-08-03 MED ORDER — ONDANSETRON HCL 4 MG/2ML IJ SOLN
INTRAMUSCULAR | Status: DC | PRN
  Administered 2019-08-03: 4 mg via INTRAVENOUS

## 2019-08-03 MED ORDER — DEXAMETHASONE SODIUM PHOSPHATE 4 MG/ML IJ SOLN
INTRAMUSCULAR | Status: DC | PRN
  Administered 2019-08-03: 5 mg via INTRAVENOUS

## 2019-08-03 MED ORDER — MENTHOL 3 MG MT LOZG: 1.0000 | LOZENGE | OROMUCOSAL | Status: DC | PRN

## 2019-08-03 MED ORDER — TRANEXAMIC ACID-NACL 1000-0.7 MG/100ML-% IV SOLN
INTRAVENOUS | Status: AC
  Filled 2019-08-03: qty 100

## 2019-08-03 MED ORDER — KETOROLAC TROMETHAMINE 30 MG/ML IJ SOLN
30.0000 mg | Freq: Four times a day (QID) | INTRAMUSCULAR | Status: AC
  Administered 2019-08-03 – 2019-08-04 (×4): 30 mg via INTRAVENOUS
  Filled 2019-08-03 (×4): qty 1

## 2019-08-03 MED ORDER — MEPERIDINE HCL 25 MG/ML IJ SOLN: 6.2500 mg | INTRAMUSCULAR | Status: DC | PRN

## 2019-08-03 MED ORDER — PHENYLEPHRINE HCL-NACL 20-0.9 MG/250ML-% IV SOLN
INTRAVENOUS | Status: AC
  Filled 2019-08-03: qty 250

## 2019-08-03 MED ORDER — NALOXONE HCL 0.4 MG/ML IJ SOLN: 0.4000 mg | INTRAMUSCULAR | Status: DC | PRN

## 2019-08-03 MED ORDER — CEFAZOLIN SODIUM-DEXTROSE 2-4 GM/100ML-% IV SOLN
INTRAVENOUS | Status: AC
  Filled 2019-08-03: qty 100

## 2019-08-03 MED ORDER — SODIUM CHLORIDE 0.9 % IV SOLN: INTRAVENOUS | Status: DC | PRN

## 2019-08-03 MED ORDER — OXYTOCIN 40 UNITS IN NORMAL SALINE INFUSION - SIMPLE MED: 2.5000 [IU]/h | INTRAVENOUS | Status: AC

## 2019-08-03 MED ORDER — KETOROLAC TROMETHAMINE 30 MG/ML IJ SOLN: 30.0000 mg | Freq: Four times a day (QID) | INTRAMUSCULAR | Status: AC | PRN

## 2019-08-03 MED ORDER — KETOROLAC TROMETHAMINE 30 MG/ML IJ SOLN
INTRAMUSCULAR | Status: AC
  Filled 2019-08-03: qty 1

## 2019-08-03 MED ORDER — SIMETHICONE 80 MG PO CHEW: 80.0000 mg | CHEWABLE_TABLET | ORAL | Status: DC | PRN

## 2019-08-03 MED ORDER — OXYTOCIN 40 UNITS IN NORMAL SALINE INFUSION - SIMPLE MED
INTRAVENOUS | Status: AC
  Filled 2019-08-03: qty 1000

## 2019-08-03 MED ORDER — HYDROCODONE-ACETAMINOPHEN 5-325 MG PO TABS: 1.0000 | ORAL_TABLET | ORAL | Status: DC | PRN

## 2019-08-03 MED ORDER — DIPHENHYDRAMINE HCL 25 MG PO CAPS: 25.0000 mg | ORAL_CAPSULE | Freq: Four times a day (QID) | ORAL | Status: DC | PRN

## 2019-08-03 MED ORDER — IBUPROFEN 800 MG PO TABS
800.0000 mg | ORAL_TABLET | Freq: Three times a day (TID) | ORAL | Status: DC
  Administered 2019-08-04 – 2019-08-05 (×2): 800 mg via ORAL
  Filled 2019-08-03 (×3): qty 1

## 2019-08-03 MED ORDER — SIMETHICONE 80 MG PO CHEW
80.0000 mg | CHEWABLE_TABLET | ORAL | Status: DC
  Administered 2019-08-03 – 2019-08-04 (×2): 80 mg via ORAL
  Filled 2019-08-03 (×2): qty 1

## 2019-08-03 MED ORDER — DIPHENHYDRAMINE HCL 25 MG PO CAPS: 25.0000 mg | ORAL_CAPSULE | ORAL | Status: DC | PRN

## 2019-08-03 MED ORDER — NALOXONE HCL 4 MG/10ML IJ SOLN
1.0000 ug/kg/h | INTRAVENOUS | Status: DC | PRN
  Filled 2019-08-03: qty 5

## 2019-08-03 MED ORDER — MORPHINE SULFATE (PF) 0.5 MG/ML IJ SOLN
INTRAMUSCULAR | Status: AC
  Filled 2019-08-03: qty 10

## 2019-08-03 MED ORDER — SENNOSIDES-DOCUSATE SODIUM 8.6-50 MG PO TABS
2.0000 | ORAL_TABLET | ORAL | Status: DC
  Administered 2019-08-03 – 2019-08-04 (×2): 2 via ORAL
  Filled 2019-08-03 (×2): qty 2

## 2019-08-03 SURGICAL SUPPLY — 38 items
BENZOIN TINCTURE PRP APPL 2/3 (GAUZE/BANDAGES/DRESSINGS) ×3 IMPLANT
CATH ROBINSON RED A/P 16FR (CATHETERS) IMPLANT
CHLORAPREP W/TINT 26ML (MISCELLANEOUS) ×3 IMPLANT
CLAMP CORD UMBIL (MISCELLANEOUS) IMPLANT
CLOSURE WOUND 1/2 X4 (GAUZE/BANDAGES/DRESSINGS) ×1
CLOTH BEACON ORANGE TIMEOUT ST (SAFETY) ×3 IMPLANT
DRSG OPSITE POSTOP 4X10 (GAUZE/BANDAGES/DRESSINGS) ×3 IMPLANT
ELECT REM PT RETURN 9FT ADLT (ELECTROSURGICAL) ×3
ELECTRODE REM PT RTRN 9FT ADLT (ELECTROSURGICAL) ×1 IMPLANT
EXTRACTOR VACUUM M CUP 4 TUBE (SUCTIONS) IMPLANT
EXTRACTOR VACUUM M CUP 4' TUBE (SUCTIONS)
GLOVE BIOGEL PI IND STRL 7.0 (GLOVE) ×1 IMPLANT
GLOVE BIOGEL PI IND STRL 7.5 (GLOVE) ×1 IMPLANT
GLOVE BIOGEL PI INDICATOR 7.0 (GLOVE) ×2
GLOVE BIOGEL PI INDICATOR 7.5 (GLOVE) ×2
GLOVE ECLIPSE 7.5 STRL STRAW (GLOVE) ×3 IMPLANT
GOWN STRL REUS W/TWL LRG LVL3 (GOWN DISPOSABLE) ×9 IMPLANT
KIT ABG SYR 3ML LUER SLIP (SYRINGE) IMPLANT
NDL HYPO 25X5/8 SAFETYGLIDE (NEEDLE) IMPLANT
NEEDLE HYPO 25X5/8 SAFETYGLIDE (NEEDLE) IMPLANT
NS IRRIG 1000ML POUR BTL (IV SOLUTION) ×3 IMPLANT
PACK C SECTION WH (CUSTOM PROCEDURE TRAY) ×3 IMPLANT
PAD ABD 7.5X8 STRL (GAUZE/BANDAGES/DRESSINGS) ×4 IMPLANT
PAD OB MATERNITY 4.3X12.25 (PERSONAL CARE ITEMS) ×3 IMPLANT
PENCIL SMOKE EVAC W/HOLSTER (ELECTROSURGICAL) ×3 IMPLANT
RTRCTR C-SECT PINK 25CM LRG (MISCELLANEOUS) ×3 IMPLANT
STRIP CLOSURE SKIN 1/2X4 (GAUZE/BANDAGES/DRESSINGS) ×2 IMPLANT
SUT MON AB 2-0 CT1 27 (SUTURE) ×4 IMPLANT
SUT PLAIN 2 0 (SUTURE) ×2
SUT PLAIN ABS 2-0 54XMFL TIE (SUTURE) ×1 IMPLANT
SUT VIC AB 0 CTX 36 (SUTURE) ×6
SUT VIC AB 0 CTX36XBRD ANBCTRL (SUTURE) ×3 IMPLANT
SUT VIC AB 2-0 CT1 27 (SUTURE) ×2
SUT VIC AB 2-0 CT1 TAPERPNT 27 (SUTURE) ×1 IMPLANT
SUT VIC AB 4-0 KS 27 (SUTURE) ×3 IMPLANT
TOWEL OR 17X24 6PK STRL BLUE (TOWEL DISPOSABLE) ×3 IMPLANT
TRAY FOLEY W/BAG SLVR 14FR LF (SET/KITS/TRAYS/PACK) ×3 IMPLANT
WATER STERILE IRR 1000ML POUR (IV SOLUTION) ×3 IMPLANT

## 2019-08-03 NOTE — Transfer of Care (Signed)
Immediate Anesthesia Transfer of Care Note  Patient: Kayla Krueger  Procedure(s) Performed: CESAREAN SECTION WITH BILATERAL TUBAL LIGATION (N/A Abdomen)  Patient Location: PACU  Anesthesia Type:Spinal  Level of Consciousness: awake, alert  and oriented  Airway & Oxygen Therapy: Patient Spontanous Breathing  Post-op Assessment: Report given to RN and Post -op Vital signs reviewed and stable  Post vital signs: Reviewed and stable  Last Vitals:  Vitals Value Taken Time  BP 134/72 08/03/19 1045  Temp    Pulse 104 08/03/19 1048  Resp 14 08/03/19 1048  SpO2 100 % 08/03/19 1048  Vitals shown include unvalidated device data.  Last Pain:  Vitals:   08/03/19 0746  TempSrc: Oral         Complications: No apparent anesthesia complications

## 2019-08-03 NOTE — Anesthesia Postprocedure Evaluation (Signed)
Anesthesia Post Note  Patient: Forensic scientist  Procedure(s) Performed: CESAREAN SECTION WITH BILATERAL TUBAL LIGATION (N/A Abdomen)     Patient location during evaluation: PACU Anesthesia Type: Spinal Level of consciousness: oriented and awake and alert Pain management: pain level controlled Vital Signs Assessment: post-procedure vital signs reviewed and stable Respiratory status: spontaneous breathing, respiratory function stable and nonlabored ventilation Cardiovascular status: blood pressure returned to baseline and stable Postop Assessment: no headache, no backache, no apparent nausea or vomiting, spinal receding and patient able to bend at knees Anesthetic complications: no    Last Vitals:  Vitals:   08/03/19 1142 08/03/19 1143  BP:    Pulse: 98 94  Resp: 20 16  Temp:    SpO2: 99% 98%    Last Pain:  Vitals:   08/03/19 1130  TempSrc:   PainSc: 0-No pain   Pain Goal:                   Dontaye Hur A.

## 2019-08-03 NOTE — Anesthesia Preprocedure Evaluation (Addendum)
Anesthesia Evaluation  Patient identified by MRN, date of birth, ID band Patient awake    Reviewed: Allergy & Precautions, Patient's Chart, lab work & pertinent test results  Airway Mallampati: II  TM Distance: >3 FB Neck ROM: Full    Dental no notable dental hx. (+) Teeth Intact   Pulmonary neg pulmonary ROS, former smoker,    Pulmonary exam normal breath sounds clear to auscultation       Cardiovascular hypertension, Normal cardiovascular exam Rhythm:Regular Rate:Normal     Neuro/Psych negative neurological ROS  negative psych ROS   GI/Hepatic Neg liver ROS, GERD  Poorly Controlled,  Endo/Other  diabetesMorbid obesity  Renal/GU negative Renal ROS  negative genitourinary   Musculoskeletal   Abdominal (+) + obese,   Peds  Hematology   Anesthesia Other Findings   Reproductive/Obstetrics (+) Pregnancy AMA Hx/o Gestational HTN and DM with previous pregnancy Hx/o Previous C/Section x 2 Undesired fertility                            Anesthesia Physical Anesthesia Plan  ASA: III  Anesthesia Plan: Spinal and Combined Spinal and Epidural   Post-op Pain Management:    Induction:   PONV Risk Score and Plan: 4 or greater and Scopolamine patch - Pre-op, Ondansetron and Treatment may vary due to age or medical condition  Airway Management Planned: Natural Airway  Additional Equipment:   Intra-op Plan:   Post-operative Plan:   Informed Consent: I have reviewed the patients History and Physical, chart, labs and discussed the procedure including the risks, benefits and alternatives for the proposed anesthesia with the patient or authorized representative who has indicated his/her understanding and acceptance.     Dental advisory given  Plan Discussed with: CRNA and Surgeon  Anesthesia Plan Comments:       Anesthesia Quick Evaluation

## 2019-08-03 NOTE — Discharge Summary (Addendum)
Postpartum Discharge Summary     Patient Name: Kayla Krueger DOB: 1980/05/09 MRN: [id number]  Date of admission: 08/03/2019 Delivering Provider: Truett Mainland   Date of discharge: 08/05/2019  Admitting diagnosis: History of 2 cesarean sections [Z98.891] Intrauterine pregnancy: [redacted]w[redacted]d    Secondary diagnosis:  Active Problems:   Supervision of other high risk pregnancy, antepartum   History of cesarean section   History of 3 cesarean sections   Cesarean delivery delivered   Status post bilateral salpingectomy  Additional problems: substance use during pregnancy     Discharge diagnosis: Term Pregnancy Delivered and Bilateral salpingectomy                                                                                                Post partum procedures:postpartum bilateral salpingectomy  Augmentation: n/a  Complications: None  Hospital course:  Scheduled C/S   39y.o. yo [id number] admitted to the hospital 08/03/2019 for scheduled cesarean section with the following indication:history of two prior cesareans.  Membrane Rupture Time/Date: 9:49 AM ,08/03/2019   Patient delivered a Viable infant.08/03/2019  Details of operation can be found in separate operative note.  Pateint had an uncomplicated postpartum course.  She is ambulating, tolerating a regular diet, passing flatus, and urinating well. Patient is discharged home in stable condition on  08/05/19        Delivery time: 9:50 AM    Magnesium Sulfate received: No BMZ received: No Rhophylac:N/A MMR:N/A Transfusion:No  Physical exam  Vitals:   08/04/19 1413 08/04/19 1923 08/04/19 2130 08/05/19 0517  BP: 132/73 137/83 128/67 130/80  Pulse: 79 89 82 80  Resp: '18 17 16 17  ' Temp: 97.6 F (36.4 C) 98.4 F (36.9 C) 98.4 F (36.9 C) 98.7 F (37.1 C)  TempSrc: Oral Oral Oral Oral  SpO2: 98% 100% 100% 100%  Weight:      Height:       General: alert Lochia: appropriate Uterine Fundus: firm Incision:  Healing well with no significant drainage, No significant erythema DVT Evaluation: No evidence of DVT seen on physical exam. Labs: Lab Results  Component Value Date   WBC 13.4 (H) 08/04/2019   HGB 9.5 (L) 08/04/2019   HCT 30.5 (L) 08/04/2019   MCV 82.7 08/04/2019   PLT 286 08/04/2019   CMP Latest Ref Rng & Units 08/01/2019  Glucose 70 - 99 mg/dL 87  BUN 6 - 20 mg/dL 8  Creatinine 0.44 - 1.00 mg/dL 0.77  Sodium 135 - 145 mmol/L 137  Potassium 3.5 - 5.1 mmol/L 4.2  Chloride 98 - 111 mmol/L 106  CO2 22 - 32 mmol/L 22  Calcium 8.9 - 10.3 mg/dL 9.3  Total Protein 6.5 - 8.1 g/dL 6.6  Total Bilirubin 0.3 - 1.2 mg/dL 0.6  Alkaline Phos 38 - 126 U/L 212(H)  AST 15 - 41 U/L 21  ALT 0 - 44 U/L 18   Edinburgh Score: Edinburgh Postnatal Depression Scale Screening Tool 08/03/2019  I have been able to laugh and see the funny side of things. 0  I have looked forward with enjoyment to  things. 0  I have blamed myself unnecessarily when things went wrong. 1  I have been anxious or worried for no good reason. 0  I have felt scared or panicky for no good reason. 0  Things have been getting on top of me. 1  I have been so unhappy that I have had difficulty sleeping. 0  I have felt sad or miserable. 0  I have been so unhappy that I have been crying. 1  The thought of harming myself has occurred to me. 0  Edinburgh Postnatal Depression Scale Total 3    Discharge instruction: per After Visit Summary and "Baby and Me Booklet".  After visit meds:  Allergies as of 08/05/2019   No Known Allergies     Medication List    STOP taking these medications   AMBULATORY NON FORMULARY MEDICATION   terconazole 0.4 % vaginal cream Commonly known as: TERAZOL 7     TAKE these medications   ferrous sulfate 325 (65 FE) MG tablet Take 1 tablet (325 mg total) by mouth daily with breakfast.   HYDROcodone-acetaminophen 5-325 MG tablet Commonly known as: NORCO/VICODIN Take 1 tablet by mouth every 4 (four)  hours as needed for moderate pain or severe pain.   ibuprofen 800 MG tablet Commonly known as: ADVIL Take 1 tablet (800 mg total) by mouth every 8 (eight) hours.   PRENATAL VITAMINS PO Take 1 tablet by mouth daily.       Diet: routine diet  Activity: Advance as tolerated. Pelvic rest for 6 weeks.   Outpatient follow TK:[id number] check in 1-2wks; PP visit in 4-6wks Follow up Appt: Future Appointments  Date Time Provider [place]  08/19/2019 11:00 AM Truett Mainland, DO [facility] None   Follow up Visit:   Please schedule this patient for Postpartum visit in: 6 weeks with the following provider: Any provider Virtual For C/S patients schedule nurse incision check in weeks 2 weeks: yes High risk pregnancy complicated by: history 2 prior CS, scant prenatal care Delivery mode:  CS Anticipated Birth Control:  s/p bilateral salpingectomy PP Procedures needed: Incision check  Schedule Integrated BH visit: no   Newborn Data: Live born female  Birth Weight:  47 APGAR: 3, 9  Newborn Delivery   Birth date/time: 08/03/2019 09:50:00 Delivery type: C-Section, Low Transverse Trial of labor: No C-section categorization: Repeat      Baby Feeding: Breast Disposition:home with mother   08/05/2019 Benay Pike, MD   CNM attestation I have seen and examined this patient and agree with above documentation in the resident's note.   Kayla Krueger is a 39 y.o. [id number] s/p rLTCS and BTL.   Pain is well controlled.  Plan for birth control is bilateral tubal ligation.  Method of Feeding: breast  PE:  BP 130/80 (BP Location: Left Arm)   Pulse 80   Temp 98.7 F (37.1 C) (Oral)   Resp 17   Ht '5\' 5"'  (1.651 m)   Wt 118.8 kg   LMP 11/03/2018 (Exact Date)   SpO2 100%   Breastfeeding Unknown   BMI 43.60 kg/m  Fundus firm  Recent Labs    08/04/19 0515  HGB 9.5*  HCT 30.5*     Plan: discharge today - postpartum care discussed - f/u clinic in 2wks for incision  check, then 4-6 weeks for postpartum visit   Myrtis Ser, CNM 2:45 PM

## 2019-08-03 NOTE — H&P (Signed)
Faculty Practice H&P  Kayla Krueger is a 39 y.o. female [phone] with IUP at [redacted]w[redacted]d presenting for elective repeat cesarean section with tubal ligation. Pregnancy was been complicated by AMA, 2 prior c/s, h/o gestational hypertension.    Pt states she has been having no contractions, no vaginal bleeding, intact membranes, with normal fetal movement.     Prenatal Course Source of Care: [facility] with onset of care at 15 weeks  Pregnancy complications or risks: Patient Active Problem List   Diagnosis Date Noted  . History of 2 cesarean sections 08/03/2019  . Limited prenatal care 07/09/2019  . Advanced maternal age in multigravida 03/22/2019  . Supervision of other high risk pregnancy, antepartum 02/18/2019  . History of stillbirth in currently pregnant patient, second trimester 02/18/2019  . History of cesarean section 02/18/2019  . History of gestational hypertension 02/18/2019   She desires bilateral tubal ligation for contraception.  She plans to breastfeed  Prenatal labs and studies: ABO, Rh: --/--/O POS, O POS Performed at [facility], [address], [place], [place] [address]  [phone]) Antibody: NEG (04/11 0926) Rubella:   RPR: NON REACTIVE (04/11 0926)  HBsAg:    HIV: Non Reactive (03/19 1024)  GBS: Negative/-- (03/19 0835)  1hr Glucola: 177 Genetic screening: normal Anatomy US: normal  Past Medical History:  Past Medical History:  Diagnosis Date  . Gestational diabetes   . Hypertension   . Medical history non-contributory     Past Surgical History:  Past Surgical History:  Procedure Laterality Date  . CESAREAN SECTION      Obstetrical History:  OB History    Gravida  5   Para  3   Term      Preterm  3   AB  1   Living  2     SAB  1   TAB      Ectopic      Multiple      Live Births  2           Gynecological History:  OB History    Gravida  5   Para  3   Term      Preterm  3   AB  1   Living  2     SAB  1    TAB      Ectopic      Multiple      Live Births  2           Social History:  Social History   Socioeconomic History  . Marital status: Single    Spouse name: Not on file  . Number of children: Not on file  . Years of education: Not on file  . Highest education level: Not on file  Occupational History  . Not on file  Tobacco Use  . Smoking status: Former Smoker    Types: Cigarettes    Quit date: 05/06/2018    Years since quitting: 1.2  . Smokeless tobacco: Never Used  Substance and Sexual Activity  . Alcohol use: Never  . Drug use: Never  . Sexual activity: Not Currently    Birth control/protection: None  Other Topics Concern  . Not on file  Social History Narrative  . Not on file   Social Determinants of Health   Financial Resource Strain:   . Difficulty of Paying Living Expenses:   Food Insecurity:   . Worried About Programme researcher, broadcasting/film/video in the Last Year:   .  Ran Out of Food in the Last Year:   Transportation Needs:   . Film/video editor (Medical):   Marland Kitchen Lack of Transportation (Non-Medical):   Physical Activity:   . Days of Exercise per Week:   . Minutes of Exercise per Session:   Stress:   . Feeling of Stress :   Social Connections:   . Frequency of Communication with Friends and Family:   . Frequency of Social Gatherings with Friends and Family:   . Attends Religious Services:   . Active Member of Clubs or Organizations:   . Attends Archivist Meetings:   Marland Kitchen Marital Status:     Family History:  Family History  Problem Relation Age of Onset  . Breast cancer Mother     Medications:  Prenatal vitamins,  Current Facility-Administered Medications  Medication Dose Route Frequency Provider Last Rate Last Admin  . ceFAZolin (ANCEF) IVPB 2g/100 mL premix  2 g Intravenous On Call to OR Clarnce Flock, MD      . lactated ringers infusion   Intravenous Continuous Truett Mainland, DO 125 mL/hr at 08/03/19 0805 New Bag at 08/03/19 0805   . sodium citrate-citric acid (ORACIT) solution 30 mL  30 mL Oral On Call to OR Clarnce Flock, MD        Allergies: No Known Allergies  Review of Systems: - negative  Physical Exam: Blood pressure (!) 152/96, temperature 98.2 F (36.8 C), temperature source Oral, resp. rate 18, height 5\' 5"  (1.651 m), weight 118.8 kg, last menstrual period 11/03/2018, SpO2 100 %. GENERAL: Well-developed, well-nourished female in no acute distress.  LUNGS: Clear to auscultation bilaterally.  HEART: Regular rate and rhythm. ABDOMEN: Soft, nontender, nondistended, gravid. EFW 8 lbs EXTREMITIES: Nontender, no edema, 2+ distal pulses. FHT:  Baseline rate 147 bpm    Pertinent Labs/Studies:   Lab Results  Component Value Date   WBC 6.2 08/01/2019   HGB 11.3 (L) 08/01/2019   HCT 36.0 08/01/2019   MCV 81.4 08/01/2019   PLT 355 08/01/2019    Assessment : Kayla Krueger is a 39 y.o. [id number] at 103w0d being admitted for cesarean section secondary to elective repeat with tubal ligation.  Plan: The risks of cesarean section discussed with the patient included but were not limited to: bleeding which may require transfusion or reoperation; infection which may require antibiotics; injury to bowel, bladder, ureters or other surrounding organs; injury to the fetus; need for additional procedures including hysterectomy in the event of a life-threatening hemorrhage; placental abnormalities wth subsequent pregnancies, incisional problems, thromboembolic phenomenon and other postoperative/anesthesia complications. 1% failure rate for tubal. The patient concurred with the proposed plan, giving informed written consent for the procedure.   Patient has been NPO since last night, and will remain NPO for procedure.  Preoperative prophylactic Ancef ordered on call to the OR.     Truett Mainland, DO 08/03/2019, 8:35 AM

## 2019-08-03 NOTE — Op Note (Signed)
Kayla Krueger PROCEDURE DATE: 08/03/2019  PREOPERATIVE DIAGNOSIS: Intrauterine pregnancy at  [redacted]w[redacted]d weeks gestation; history of two prior cesarean sections  POSTOPERATIVE DIAGNOSIS: The same  PROCEDURE: Repeat Low Transverse Cesarean Section, bilateral salpingectomy  SURGEON:  Dr. Candelaria Krueger   ASSISTANT: Dr. Mary Sella Crissie Krueger  INDICATIONS: Kayla Krueger is a 39 y.o. [id number] at [redacted]w[redacted]d scheduled for cesarean section secondary to history of two prior cesareans.  The risks of cesarean section were discussed with the patient including but were not limited to: bleeding which may require transfusion or reoperation; infection which may require antibiotics; injury to bowel, bladder, ureters or other surrounding organs; injury to the fetus; need for additional procedures including hysterectomy in the event of a life-threatening hemorrhage; placental abnormalities wth subsequent pregnancies, incisional problems, thromboembolic phenomenon and other postoperative/anesthesia complications.  Patient also desires permanent sterilization.  Other reversible forms of contraception were discussed with patient; she declines all other modalities. Risks of procedure discussed with patient including but not limited to: risk of regret, permanence of method, bleeding, infection, injury to surrounding organs and need for additional procedures.  Failure risk of about 1% with increased risk of ectopic gestation if pregnancy occurs was also discussed with patient.  Also discussed possibility of post-tubal pain syndrome. The patient concurred with the proposed plan, giving informed written consent for the procedures.  Patient has been NPO since last night and she will remain NPO for procedure. Anesthesia and OR aware.  Preoperative prophylactic antibiotics and SCDs ordered on call to the OR.  To OR when ready.  FINDINGS:  Viable female infant in cephalic presentation.  Apgars 9 and 9, weight 3560 grams. Clear amniotic fluid.   Intact placenta, three vessel cord.  Normal uterus, fallopian tubes and ovaries bilaterally.  ANESTHESIA:    Spinal INTRAVENOUS FLUIDS: 1,100 ml ESTIMATED BLOOD LOSS: 401 ml URINE OUTPUT:  100 ml SPECIMENS: Placenta sent to L&D COMPLICATIONS: None immediate   PROCEDURE IN DETAIL:  The patient received intravenous antibiotics and had sequential compression devices applied to her lower extremities while in the preoperative area.  She was then taken to the operating room where spinal anesthesia was administered (epidural anesthesia was dosed up to surgical level) and was found to be adequate. She was then placed in a dorsal supine position with a leftward tilt, and prepped and draped in a sterile manner.  A foley catheter was placed into her bladder and attached to constant gravity, which drained clear fluid throughout.  After an adequate timeout was performed, a Pfannenstiel skin incision was made with scalpel and carried through to the underlying layer of fascia. The fascia was incised in the midline and this incision was extended bilaterally using the Mayo scissors. Kocher clamps were applied to the superior aspect of the fascial incision and the underlying rectus muscles were dissected off bluntly. A similar process was carried out on the inferior aspect of the facial incision. The rectus muscles were separated in the midline bluntly and the peritoneum was entered bluntly. An Alexis retractor was placed to aid in visualization of the uterus.  Attention was turned to the lower uterine segment where a transverse hysterotomy was made with a scalpel and extended bilaterally bluntly. The infant was successfully delivered, and cord was clamped and cut and infant was handed over to awaiting neonatology team. Uterine massage was then administered and the placenta delivered intact with three-vessel cord. The uterus was then cleared of clot and debris.  The hysterotomy was closed with 0 Vicryl in a running  locked  fashion, and an imbricating layer was also placed with a 0 Vicryl. Overall, excellent hemostasis was noted. Attention was then turned to the left fallopian tube, which was clamped in two segments with a Kelly clamp to include the fimbriae and full length of the tube, transected with Metzenbaum scissors, and then ligated with 2-0 Monocryl with excellent hemostasis noted. The right fallopian tube was then clamped, transected, and ligated in the same manner. There was some oozing noted from one of the pedicles on the R side, for which an additional plain gut suture was placed. The abdomen and the pelvis were then cleared of all clot and debris and the Ubaldo Glassing was removed. Hemostasis was confirmed on all surfaces.  The peritoneum was reapproximated using 2-0 vicryl running stitches. The fascia was then closed using 0 Vicryl in a running fashion. The skin was closed with 4-0 vicryl. The patient tolerated the procedure well. Sponge, lap, instrument and needle counts were correct x 2. She was taken to the recovery room in stable condition.    Kayla Flock, MD 08/03/2019 10:36 AM

## 2019-08-03 NOTE — Lactation Note (Signed)
This note was copied from a baby's chart. Lactation Consultation Note  Patient Name: Kayla Krueger [id number] Date: 08/03/2019 Reason for consult: Initial assessment;Term;Other (Comment)(AMA)  Visited with mom of a 70 hours old FT female, she's a P4 and experienced BF. She exclusively pumped and bottle feed baby # 1 and BF her last baby for 7 months. She had a still birth also. Hx of THC during this pregnancy; still awaiting UDS results. Mom participated in the [facility] program at the [facility] and she's already familiar with hand expression, and able to get colostrum when doing so, praised her for her efforts. She has a single electric breast pump at home.  Offered assistance with latch but mom politely declined, she told LC baby already fed. Asked mom to call for assistance when needed. Per mom BF is going well and baby has been latching consistently. Baby asleep on her bassinet at the time of [facility] consultation.  LC noticed that she had a "baby boppy" pillow around her head, educated parents about safe sleep practices, they voiced understanding. She also had a pacifier, LC recommended delaying pacifier use till BF is fully established around 31-67 weeks of age.Reviewed normal newborn behavior, feeding cues, cluster feeding and size of baby's stomach.   Feeding plan:  1. Encouraged mom to feed baby 8-12 times/24 hours or sooner if feeding cues are present 2. Hand expression and spoon/finger feeding were also encouraged  BF brochure, BF resources and feeding diary were reviewed. Dad present but taking a nap during [facility] consultation. Mom reported all questions and concerns were answered, she's aware of LC OP services and will call PRN.    Maternal Data Formula Feeding for Exclusion: No Has patient been taught Hand Expression?: Yes Does the patient have breastfeeding experience prior to this delivery?: Yes  Feeding Feeding Type: Breast Fed  LATCH Score                    Interventions Interventions: Breast feeding basics reviewed  Lactation Tools Discussed/Used WIC Program: Yes   Consult Status Consult Status: Follow-up Date: 08/04/19 Follow-up type: In-patient    Reyn Faivre Venetia Constable 08/03/2019, 10:36 PM

## 2019-08-03 NOTE — Anesthesia Procedure Notes (Signed)
Spinal  Patient location during procedure: OR Start time: 08/03/2019 9:26 AM End time: 08/03/2019 9:30 AM Staffing Performed: anesthesiologist  Anesthesiologist: Mal Amabile, MD Preanesthetic Checklist Completed: patient identified, IV checked, site marked, risks and benefits discussed, surgical consent, monitors and equipment checked, pre-op evaluation and timeout performed Spinal Block Patient position: sitting Prep: DuraPrep and site prepped and draped Patient monitoring: heart rate, cardiac monitor, continuous pulse ox and blood pressure Approach: midline Location: L3-4 Injection technique: single-shot Needle Needle type: Pencan  Needle gauge: 24 G Needle length: 9 cm Needle insertion depth: 7 cm Assessment Sensory level: T4 Additional Notes Patient tolerated procedure well. Adequate sensory level.

## 2019-08-04 LAB — CBC
HCT: 30.5 % — ABNORMAL LOW (ref 36.0–46.0)
Hemoglobin: 9.5 g/dL — ABNORMAL LOW (ref 12.0–15.0)
MCH: 25.7 pg — ABNORMAL LOW (ref 26.0–34.0)
MCHC: 31.1 g/dL (ref 30.0–36.0)
MCV: 82.7 fL (ref 80.0–100.0)
Platelets: 286 10*3/uL (ref 150–400)
RBC: 3.69 MIL/uL — ABNORMAL LOW (ref 3.87–5.11)
RDW: 17.2 % — ABNORMAL HIGH (ref 11.5–15.5)
WBC: 13.4 10*3/uL — ABNORMAL HIGH (ref 4.0–10.5)
nRBC: 0 % (ref 0.0–0.2)

## 2019-08-04 LAB — BIRTH TISSUE RECOVERY COLLECTION (PLACENTA DONATION)

## 2019-08-04 MED ORDER — LACTATED RINGERS IV BOLUS
1000.0000 mL | Freq: Once | INTRAVENOUS | Status: AC
  Administered 2019-08-04: 12:00:00 1000 mL via INTRAVENOUS

## 2019-08-04 MED ORDER — FERROUS SULFATE 325 (65 FE) MG PO TABS
325.0000 mg | ORAL_TABLET | Freq: Every day | ORAL | Status: DC
  Administered 2019-08-04: 08:00:00 325 mg via ORAL
  Filled 2019-08-04: qty 1
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
## 2019-08-04 NOTE — Progress Notes (Addendum)
Post Partum Day 1 Subjective: Patient without complaints or concerns this morning.  Tolerating PO and reports flatus but has had no BM.  Foley still in place. Patient reports she "needs to drink more water." Pain well controlled.     Objective: Blood pressure 138/81, pulse 84, temperature 98.6 F (37 C), temperature source Oral, resp. rate 18, height 5\' 5"  (1.651 m), weight 118.8 kg, last menstrual period 11/03/2018, SpO2 100 %, unknown if currently breastfeeding.  Physical Exam:  General: alert and no distress Lochia: appropriate Uterine Fundus: firm Incision: healing well, no significant drainage DVT Evaluation: No cords or calf tenderness. Calf/Ankle edema is present bilaterally.   Recent Labs    08/01/19 0926 08/04/19 0515  HGB 11.3* 9.5*  HCT 36.0 30.5*    Assessment/Plan: Breastfeeding and Contraception : s/p bilateral tubal ligation  Hgb stable at 9.5 s/p C-section with BTL. Urine output 550 mL yesterday.  Continue LR and encourage oral hydration. Remove Foley catheter when able. VSS. Continue routine postpartum care.    LOS: 1 day    08/06/19, DO PGY-1, [facility] 08/04/2019 7:41 AM    I saw and evaluated the patient. I agree with the findings and the plan of care as documented in the resident's note. PO iron initiated. Vitals stable. Plan to remove foley this AM and follow I's and O's.   08/06/2019, MD [facility] Fellow, [facility] for [facility]

## 2019-08-04 NOTE — Progress Notes (Signed)
Dr Constance Goltz called re: output 29 ml/h. Md advised no bolus but to remove catheter and monitor I&O

## 2019-08-04 NOTE — Lactation Note (Signed)
This note was copied from a baby's chart. Lactation Consultation Note  Patient Name: Girl Jamaiya Tunnell JSEGB'T Date: 08/04/2019 Reason for consult: Follow-up assessment;Term  [facility] student completed a follow-up visit with Ms. Dieguez. FOB was also present at the time of the consult and Sa'Maya was swaddled in her bassinet. MOB reported that Sa'Maya has been feeding from each breast for a total of 25-35 minutes. Ms. Seidner described her as a "hungry baby" but feels confident with how breastfeeding is going. Ms. Vinas reported that she is not currently seeing drops of colostrum today, but noted that Sa'Maya seems satisfied following feeds.  LC student encouraged MOB to continue to feed Sa'Maya 8-12x in 24 hrs - paying attention to feeding cues. [facility] student also reminded MOB of outpatient resources/services. Ms. Laboy reported that she has no further questions or concerns and knows to call Lactation as needed.   Maternal Data Has patient been taught Hand Expression?: Yes Does the patient have breastfeeding experience prior to this delivery?: Yes  Interventions Interventions: Breast feeding basics reviewed   Consult Status Consult Status: Follow-up Date: 08/05/19 Follow-up type: In-patient    Gregery Na 08/04/2019, 9:55 PM

## 2019-08-05 LAB — SURGICAL PATHOLOGY

## 2019-08-05 MED ORDER — FERROUS SULFATE 325 (65 FE) MG PO TABS: 325.0000 mg | ORAL_TABLET | Freq: Every day | ORAL | 3 refills | Status: AC

## 2019-08-05 MED ORDER — IBUPROFEN 800 MG PO TABS: 800.0000 mg | ORAL_TABLET | Freq: Three times a day (TID) | ORAL | 0 refills | Status: AC

## 2019-08-05 MED ORDER — HYDROCODONE-ACETAMINOPHEN 5-325 MG PO TABS: 1.0000 | ORAL_TABLET | ORAL | 0 refills | Status: AC | PRN

## 2019-08-05 NOTE — Lactation Note (Signed)
This note was copied from a baby's chart. Lactation Consultation Note  Patient Name: Kayla Krueger [id number] Date: 08/05/2019 Reason for consult: Follow-up assessment  P4 mother whose infant is now 2 hours old.  This is a term baby at 61 weeks.  Mother pumped and bottle fed her first child and breast fed her last child for 7 months.  Her other baby was a stillbirth.  Mother had no questions/concerns related to breast feeding.  She will continue to feed 8-12 times/24 hours or sooner if baby shows feeding cues.  She has a manual pump and a DEBP for home use.  Mother is familiar with engorgement and did not wish to review.  She has been discharged and is packing her belongings.  Father present.    Maternal Data    Feeding    LATCH Score                   Interventions    Lactation Tools Discussed/Used     Consult Status Consult Status: Complete Date: 08/05/19 Follow-up type: Call as needed    [name] 08/05/2019, 11:08 AM

## 2019-08-05 NOTE — Discharge Instructions (Signed)

## 2019-08-09 ENCOUNTER — Telehealth: Payer: Self-pay

## 2019-08-09 NOTE — Telephone Encounter (Signed)
Patient called and needs note for her husband to return to work next Monday April 26th. Made her aware she can come by an pick up work note. Armandina Stammer RN

## 2019-08-19 ENCOUNTER — Encounter: Payer: Self-pay | Admitting: Family Medicine

## 2019-08-19 ENCOUNTER — Other Ambulatory Visit: Payer: Self-pay

## 2019-08-19 ENCOUNTER — Ambulatory Visit (INDEPENDENT_AMBULATORY_CARE_PROVIDER_SITE_OTHER): Payer: Medicaid Other | Admitting: Family Medicine

## 2019-08-19 VITALS — BP 146/94 | HR 89 | Ht 65.0 in | Wt 263.0 lb

## 2019-08-19 DIAGNOSIS — Z5189 Encounter for other specified aftercare: Secondary | ICD-10-CM

## 2019-08-19 DIAGNOSIS — O165 Unspecified maternal hypertension, complicating the puerperium: Secondary | ICD-10-CM

## 2019-08-19 MED ORDER — AMLODIPINE BESYLATE 5 MG PO TABS: 5.0000 mg | ORAL_TABLET | Freq: Every day | ORAL | 1 refills | Status: AC

## 2019-08-19 NOTE — Progress Notes (Signed)
   Subjective:    Patient ID: Kayla Krueger, female    DOB: [birth year], 39 y.o.   MRN: [id number]  HPI Patient is 2 weeks post op from a RLTCS with bilateral salpingectomy. Reports no problems.    Review of Systems    BP (!) 146/94   Pulse 89   Ht 5\' 5"  (1.651 m)   Wt 263 lb (119.3 kg)   LMP 11/03/2018 (Exact Date)   BMI 43.77 kg/m   Objective:   Physical Exam Vitals reviewed.  Constitutional:      Appearance: Normal appearance.  Abdominal:     General: Abdomen is flat.     Palpations: Abdomen is soft.     Comments: Subcutaneous tissue visible on left corner. Silver nitrate applied.  Skin:    Capillary Refill: Capillary refill takes less than 2 seconds.  Neurological:     Mental Status: She is alert.  Psychiatric:        Mood and Affect: Mood normal.        Behavior: Behavior normal.        Thought Content: Thought content normal.        Judgment: Judgment normal.       Assessment & Plan:  1. Visit for wound check Overall, well healed.   2. Postpartum hypertension Start amlodipine 5mg  daily. Recheck in 2 weeks.

## 2019-09-06 ENCOUNTER — Ambulatory Visit: Payer: Medicaid Other | Admitting: Obstetrics & Gynecology

## 2020-03-17 DIAGNOSIS — K047 Periapical abscess without sinus: Secondary | ICD-10-CM | POA: Diagnosis not present

## 2020-03-17 DIAGNOSIS — K0889 Other specified disorders of teeth and supporting structures: Secondary | ICD-10-CM | POA: Diagnosis not present

## 2020-03-17 DIAGNOSIS — R220 Localized swelling, mass and lump, head: Secondary | ICD-10-CM | POA: Diagnosis not present

## 2020-03-17 DIAGNOSIS — K0381 Cracked tooth: Secondary | ICD-10-CM | POA: Diagnosis not present

## 2020-03-19 DIAGNOSIS — Y929 Unspecified place or not applicable: Secondary | ICD-10-CM | POA: Diagnosis not present

## 2020-03-19 DIAGNOSIS — I10 Essential (primary) hypertension: Secondary | ICD-10-CM | POA: Diagnosis not present

## 2020-03-19 DIAGNOSIS — S0003XA Contusion of scalp, initial encounter: Secondary | ICD-10-CM | POA: Diagnosis not present

## 2020-03-19 DIAGNOSIS — S0181XA Laceration without foreign body of other part of head, initial encounter: Secondary | ICD-10-CM | POA: Diagnosis not present

## 2020-03-19 DIAGNOSIS — W228XXA Striking against or struck by other objects, initial encounter: Secondary | ICD-10-CM | POA: Diagnosis not present

## 2020-03-19 DIAGNOSIS — R52 Pain, unspecified: Secondary | ICD-10-CM | POA: Diagnosis not present

## 2020-03-19 DIAGNOSIS — R58 Hemorrhage, not elsewhere classified: Secondary | ICD-10-CM | POA: Diagnosis not present

## 2020-03-29 ENCOUNTER — Encounter: Payer: Self-pay | Admitting: General Practice

## 2020-06-11 DIAGNOSIS — Z23 Encounter for immunization: Secondary | ICD-10-CM | POA: Diagnosis not present

## 2020-06-11 DIAGNOSIS — W268XXA Contact with other sharp object(s), not elsewhere classified, initial encounter: Secondary | ICD-10-CM | POA: Diagnosis not present

## 2020-06-11 DIAGNOSIS — Y999 Unspecified external cause status: Secondary | ICD-10-CM | POA: Diagnosis not present

## 2020-06-11 DIAGNOSIS — S61011A Laceration without foreign body of right thumb without damage to nail, initial encounter: Secondary | ICD-10-CM | POA: Diagnosis not present

## 2020-07-17 DIAGNOSIS — R0789 Other chest pain: Secondary | ICD-10-CM | POA: Diagnosis not present

## 2020-07-17 DIAGNOSIS — R0689 Other abnormalities of breathing: Secondary | ICD-10-CM | POA: Diagnosis not present

## 2020-07-17 DIAGNOSIS — G4489 Other headache syndrome: Secondary | ICD-10-CM | POA: Diagnosis not present

## 2020-07-17 DIAGNOSIS — R079 Chest pain, unspecified: Secondary | ICD-10-CM | POA: Diagnosis not present

## 2020-07-17 DIAGNOSIS — M79605 Pain in left leg: Secondary | ICD-10-CM | POA: Diagnosis not present

## 2020-07-17 DIAGNOSIS — Z041 Encounter for examination and observation following transport accident: Secondary | ICD-10-CM | POA: Diagnosis not present

## 2020-07-17 DIAGNOSIS — Y92410 Unspecified street and highway as the place of occurrence of the external cause: Secondary | ICD-10-CM | POA: Diagnosis not present

## 2020-07-17 DIAGNOSIS — M79604 Pain in right leg: Secondary | ICD-10-CM | POA: Diagnosis not present

## 2020-07-17 DIAGNOSIS — S098XXA Other specified injuries of head, initial encounter: Secondary | ICD-10-CM | POA: Diagnosis not present

## 2020-07-17 DIAGNOSIS — R064 Hyperventilation: Secondary | ICD-10-CM | POA: Diagnosis not present

## 2020-07-17 DIAGNOSIS — S161XXA Strain of muscle, fascia and tendon at neck level, initial encounter: Secondary | ICD-10-CM | POA: Diagnosis not present

## 2020-07-17 DIAGNOSIS — S29011A Strain of muscle and tendon of front wall of thorax, initial encounter: Secondary | ICD-10-CM | POA: Diagnosis not present

## 2020-07-18 ENCOUNTER — Telehealth: Payer: Self-pay

## 2020-07-18 NOTE — Telephone Encounter (Signed)
Transition Care Management Unsuccessful Follow-up Telephone Call  Date of discharge and from where:  07/17/2020 from [facility]  Attempts:  1st Attempt  Reason for unsuccessful TCM follow-up call:  Missing or invalid number

## 2020-07-19 NOTE — Telephone Encounter (Signed)
Transition Care Management Unsuccessful Follow-up Telephone Call  Date of discharge and from where:  07/17/20 from [facility]  Attempts:  2nd Attempt  Reason for unsuccessful TCM follow-up call:  Missing or invalid number

## 2020-07-20 NOTE — Telephone Encounter (Signed)
Transition Care Management Unsuccessful Follow-up Telephone Call  Date of discharge and from where:  07/17/2020 - [facility]  Attempts:  3rd Attempt  Reason for unsuccessful TCM follow-up call:  Missing or invalid number - tried both numbers we have on file for pt

## 2021-02-28 DIAGNOSIS — Z20822 Contact with and (suspected) exposure to covid-19: Secondary | ICD-10-CM | POA: Diagnosis not present

## 2022-06-07 ENCOUNTER — Other Ambulatory Visit (HOSPITAL_COMMUNITY)
Admission: RE | Admit: 2022-06-07 | Discharge: 2022-06-07 | Disposition: A | Discharge status: Home / Self Care | Payer: Medicaid Other | Source: Ambulatory Visit | Attending: Family Medicine | Admitting: Family Medicine

## 2022-06-07 ENCOUNTER — Ambulatory Visit (INDEPENDENT_AMBULATORY_CARE_PROVIDER_SITE_OTHER): Payer: Medicaid Other

## 2022-06-07 VITALS — BP 136/79 | HR 62

## 2022-06-07 DIAGNOSIS — Z202 Contact with and (suspected) exposure to infections with a predominantly sexual mode of transmission: Secondary | ICD-10-CM | POA: Diagnosis present

## 2022-06-07 DIAGNOSIS — N898 Other specified noninflammatory disorders of vagina: Secondary | ICD-10-CM | POA: Diagnosis present

## 2022-06-07 DIAGNOSIS — Z113 Encounter for screening for infections with a predominantly sexual mode of transmission: Secondary | ICD-10-CM | POA: Diagnosis present

## 2022-06-07 NOTE — Progress Notes (Signed)
Chart reviewed - agree with CMA/RN documentation.  ° °

## 2022-06-07 NOTE — Progress Notes (Signed)
SUBJECTIVE:  42 y.o. female complains of  vaginal discharge for several day(s). Denies abnormal vaginal bleeding or significant pelvic pain or fever. No UTI symptoms. Possible known exposure to trichomonas.  Patient's last menstrual period was 04/23/2022 (exact date).  OBJECTIVE:  She appears well, afebrile. Urine dipstick: not done.  ASSESSMENT:  Vaginal Discharge  Screening for STDs   PLAN:  GC, chlamydia, trichomonas, BVAG, CVAG probe sent to lab. STD blood work panel ordered.  Treatment: To be determined once lab results are received ROV prn if symptoms persist or worsen.

## 2022-06-08 LAB — HEPATITIS C ANTIBODY: Hep C Virus Ab: NONREACTIVE

## 2022-06-08 LAB — RPR: RPR Ser Ql: NONREACTIVE

## 2022-06-08 LAB — HEPATITIS B SURFACE ANTIGEN: Hepatitis B Surface Ag: NEGATIVE

## 2022-06-08 LAB — HIV ANTIBODY (ROUTINE TESTING W REFLEX): HIV Screen 4th Generation wRfx: NONREACTIVE

## 2022-06-10 ENCOUNTER — Telehealth: Payer: Self-pay

## 2022-06-10 LAB — CERVICOVAGINAL ANCILLARY ONLY
Bacterial Vaginitis (gardnerella): POSITIVE — AB
Candida Glabrata: NEGATIVE
Candida Vaginitis: NEGATIVE
Chlamydia: NEGATIVE
Comment: NEGATIVE
Comment: NEGATIVE
Comment: NEGATIVE
Comment: NEGATIVE
Comment: NEGATIVE
Comment: NORMAL
Neisseria Gonorrhea: NEGATIVE
Trichomonas: POSITIVE — AB

## 2022-06-10 MED ORDER — METRONIDAZOLE 500 MG PO TABS: 500.0000 mg | ORAL_TABLET | Freq: Two times a day (BID) | ORAL | 0 refills | Status: DC

## 2022-06-10 NOTE — Addendum Note (Signed)
Addended by: Truett Mainland on: 06/10/2022 03:19 PM   Modules accepted: Orders

## 2022-06-10 NOTE — Telephone Encounter (Signed)
Patient called requesting results. Patient made aware that all of her blood work is negative and her vaginal swab results are still pending. Understanding was voiced. Ivelise Castillo l Anamika Kueker, CMA

## 2022-06-26 ENCOUNTER — Other Ambulatory Visit: Payer: Self-pay

## 2022-06-26 MED ORDER — METRONIDAZOLE 500 MG PO TABS: 500.0000 mg | ORAL_TABLET | Freq: Two times a day (BID) | ORAL | 0 refills | Status: AC

## 2022-06-26 NOTE — Progress Notes (Signed)
Patient states she never picked up her Flagyl prescription. Resent to pharmacy. Kathrene Alu RN

## 2022-07-24 ENCOUNTER — Ambulatory Visit: Payer: Medicaid Other | Admitting: Family Medicine
